# Patient Record
Sex: Male | Born: 1985 | Race: Black or African American | Hispanic: No | Marital: Single | Smoking: Current every day smoker
Health system: Southern US, Community
[De-identification: ages and names within clinical notes are randomized; demographics above are authoritative.]

## PROBLEM LIST (undated history)

## (undated) DIAGNOSIS — I209 Angina pectoris, unspecified: Secondary | ICD-10-CM

## (undated) DIAGNOSIS — R319 Hematuria, unspecified: Secondary | ICD-10-CM

## (undated) HISTORY — PX: HERNIA REPAIR: SHX51

## (undated) SURGERY — REPAIR, HERNIA, INGUINAL, INCARCERATED
Anesthesia: General | Laterality: Left

---

## 2005-08-08 ENCOUNTER — Emergency Department (HOSPITAL_COMMUNITY): Admission: EM | Admit: 2005-08-08 | Discharge: 2005-08-08 | Payer: Self-pay | Admitting: Emergency Medicine

## 2010-03-17 DIAGNOSIS — R319 Hematuria, unspecified: Secondary | ICD-10-CM

## 2010-03-17 HISTORY — DX: Hematuria, unspecified: R31.9

## 2011-09-04 ENCOUNTER — Emergency Department (HOSPITAL_COMMUNITY): Payer: Self-pay

## 2011-09-04 ENCOUNTER — Encounter (HOSPITAL_COMMUNITY): Payer: Self-pay | Admitting: *Deleted

## 2011-09-04 ENCOUNTER — Emergency Department (HOSPITAL_COMMUNITY)
Admission: EM | Admit: 2011-09-04 | Discharge: 2011-09-04 | Disposition: A | Discharge status: Home / Self Care | Payer: Self-pay | Attending: Emergency Medicine | Admitting: Emergency Medicine

## 2011-09-04 DIAGNOSIS — J020 Streptococcal pharyngitis: Secondary | ICD-10-CM | POA: Insufficient documentation

## 2011-09-04 LAB — DIFFERENTIAL
Basophils Absolute: 0 10*3/uL (ref 0.0–0.1)
Basophils Relative: 0 % (ref 0–1)
Neutro Abs: 9.4 10*3/uL — ABNORMAL HIGH (ref 1.7–7.7)

## 2011-09-04 LAB — CBC: RBC: 4.61 MIL/uL (ref 4.22–5.81)

## 2011-09-04 LAB — RAPID STREP SCREEN (MED CTR MEBANE ONLY): Streptococcus, Group A Screen (Direct): POSITIVE — AB

## 2011-09-04 LAB — BASIC METABOLIC PANEL
BUN: 13 mg/dL (ref 6–23)
CO2: 29 mEq/L (ref 19–32)
Chloride: 102 mEq/L (ref 96–112)
Creatinine, Ser: 1.08 mg/dL (ref 0.50–1.35)
Sodium: 137 mEq/L (ref 135–145)

## 2011-09-04 LAB — POCT I-STAT TROPONIN I

## 2011-09-04 MED ORDER — PENICILLIN G BENZATHINE 1200000 UNIT/2ML IM SUSP
1.2000 10*6.[IU] | INTRAMUSCULAR | Status: AC
  Administered 2011-09-04: 1.2 10*6.[IU] via INTRAMUSCULAR
  Filled 2011-09-04: qty 2

## 2011-09-04 MED ORDER — HYDROMORPHONE HCL PF 1 MG/ML IJ SOLN
1.0000 mg | Freq: Once | INTRAMUSCULAR | Status: AC
  Administered 2011-09-04: 1 mg via INTRAVENOUS
  Filled 2011-09-04: qty 1

## 2011-09-04 MED ORDER — KETOROLAC TROMETHAMINE 30 MG/ML IJ SOLN
30.0000 mg | Freq: Once | INTRAMUSCULAR | Status: AC
  Administered 2011-09-04: 30 mg via INTRAVENOUS
  Filled 2011-09-04: qty 1

## 2011-09-04 MED ORDER — SODIUM CHLORIDE 0.9 % IV BOLUS (SEPSIS)
1000.0000 mL | Freq: Once | INTRAVENOUS | Status: AC
  Administered 2011-09-04: 1000 mL via INTRAVENOUS

## 2011-09-04 NOTE — ED Provider Notes (Signed)
History     CSN: [id number]  Arrival date & time 09/04/11  1124   First MD Initiated Contact with Patient 09/04/11 1215      Chief Complaint  Patient presents with  . Sore Throat  . Chest Pain  . Back Pain    (Consider location/radiation/quality/duration/timing/severity/associated sxs/prior treatment) HPI Pt with multiple symptoms reports 2 weeks of moderate aching L chest pain, associated with muscle spasms and worse with movement. Denies any cough or SOB. Complaining of 2 weeks of sore throat, worse with swallowing and possible fever at home. He has not known medical problems. Took vicodin at home without relief.  History reviewed. No pertinent past medical history.  History reviewed. No pertinent past surgical history.  No family history on file.  History  Substance Use Topics  . Smoking status: Current Everyday Smoker  . Smokeless tobacco: Not on file  . Alcohol Use: Not on file      Review of Systems Unable to assess due to mental status.   Allergies  Review of patient's allergies indicates no known allergies.  Home Medications   Current Outpatient Rx  Name Route Sig Dispense Refill  . HYDROCODONE-ACETAMINOPHEN 5-500 MG PO TABS Oral Take 2 tablets by mouth once.      BP 110/61  Pulse 50  Temp 98.2 F (36.8 C) (Oral)  Resp 16  Ht 5\' 11"  (1.803 m)  Wt 170 lb (77.111 kg)  BMI 23.71 kg/m2  SpO2 100%  Physical Exam  Nursing note and vitals reviewed. Constitutional: He is oriented to person, place, and time. He appears well-developed and well-nourished.  HENT:  Head: Normocephalic and atraumatic.  Mouth/Throat: Oropharyngeal exudate present.       Tonsils erythematous  Eyes: EOM are normal. Pupils are equal, round, and reactive to light.  Neck: Normal range of motion. Neck supple.  Cardiovascular: Normal rate, normal heart sounds and intact distal pulses.   Pulmonary/Chest: Effort normal and breath sounds normal. He exhibits no tenderness.    Abdominal: Bowel sounds are normal. He exhibits no distension. There is no tenderness.  Musculoskeletal: Normal range of motion. He exhibits no edema and no tenderness.  Neurological: He is alert and oriented to person, place, and time. He has normal strength. No cranial nerve deficit or sensory deficit.  Skin: Skin is warm and dry. No rash noted.  Psychiatric: He has a normal mood and affect.    ED Course  Procedures (including critical care time)  Labs Reviewed  CBC - Abnormal; Notable for the following:    WBC 12.3 (*)     All other components within normal limits  DIFFERENTIAL - Abnormal; Notable for the following:    Neutro Abs 9.4 (*)     Monocytes Absolute 1.4 (*)     All other components within normal limits  RAPID STREP SCREEN - Abnormal; Notable for the following:    Streptococcus, Group A Screen (Direct) POSITIVE (*)     All other components within normal limits  BASIC METABOLIC PANEL  POCT I-STAT TROPONIN I   Dg Chest 2 View  09/04/2011  *RADIOLOGY REPORT*  Clinical Data: Chest pain.  CHEST - 2 VIEW  Comparison: None.  Findings: Heart and mediastinal contours are within normal limits. No focal opacities or effusions.  No acute bony abnormality.  IMPRESSION: No active cardiopulmonary disease.  Original Report Authenticated By: Cyndie Chime, M.D.     No diagnosis found.    MDM  Strep positive. Labs normal otherwise. Pt given PCN.  Advised to continue oral fluids and PCP followup.   Date: 09/04/2011  Rate: 57  Rhythm: normal sinus rhythm  QRS Axis: normal  Intervals: normal  ST/T Wave abnormalities: normal  Conduction Disutrbances: none  Narrative Interpretation: unremarkable           Mica Releford B. Bernette Mayers, MD 09/04/11 1456

## 2011-09-04 NOTE — ED Notes (Signed)
Patient reports he has had chest pain, sore throat, and back pain for a day or two. Patient unsure if he has had a fever. Patient denies cough

## 2011-09-04 NOTE — Discharge Instructions (Signed)

## 2011-09-08 ENCOUNTER — Encounter (HOSPITAL_COMMUNITY): Payer: Self-pay | Admitting: *Deleted

## 2011-09-08 DIAGNOSIS — F1210 Cannabis abuse, uncomplicated: Secondary | ICD-10-CM | POA: Insufficient documentation

## 2011-09-08 DIAGNOSIS — R109 Unspecified abdominal pain: Secondary | ICD-10-CM | POA: Insufficient documentation

## 2011-09-08 DIAGNOSIS — K4030 Unilateral inguinal hernia, with obstruction, without gangrene, not specified as recurrent: Principal | ICD-10-CM | POA: Insufficient documentation

## 2011-09-08 DIAGNOSIS — F17200 Nicotine dependence, unspecified, uncomplicated: Secondary | ICD-10-CM | POA: Insufficient documentation

## 2011-09-08 HISTORY — PX: INGUINAL HERNIA REPAIR: SUR1180

## 2011-09-08 LAB — URINE MICROSCOPIC-ADD ON

## 2011-09-08 LAB — URINALYSIS, ROUTINE W REFLEX MICROSCOPIC
Bilirubin Urine: NEGATIVE
Ketones, ur: NEGATIVE mg/dL
Leukocytes, UA: NEGATIVE
Nitrite: NEGATIVE
Protein, ur: NEGATIVE mg/dL
Specific Gravity, Urine: 1.034 — ABNORMAL HIGH (ref 1.005–1.030)

## 2011-09-08 NOTE — ED Notes (Signed)
The pt is c/o rt groin pain for 2-3 days.  Worse tonight while at work.  The swelling in his rt groin is increasing in size

## 2011-09-09 ENCOUNTER — Encounter (HOSPITAL_COMMUNITY)
Admission: EM | Disposition: A | Discharge status: Home / Self Care | Payer: Self-pay | Source: Ambulatory Visit | Attending: Emergency Medicine

## 2011-09-09 ENCOUNTER — Encounter (HOSPITAL_COMMUNITY): Payer: Self-pay | Admitting: Emergency Medicine

## 2011-09-09 ENCOUNTER — Observation Stay (HOSPITAL_COMMUNITY)
Admission: EM | Admit: 2011-09-09 | Discharge: 2011-09-09 | Disposition: A | Discharge status: Home / Self Care | Payer: Self-pay | Source: Ambulatory Visit | Attending: Surgery | Admitting: Surgery

## 2011-09-09 ENCOUNTER — Encounter (HOSPITAL_COMMUNITY): Payer: Self-pay | Admitting: Anesthesiology

## 2011-09-09 ENCOUNTER — Emergency Department (HOSPITAL_COMMUNITY): Payer: Self-pay | Admitting: Anesthesiology

## 2011-09-09 ENCOUNTER — Encounter (HOSPITAL_COMMUNITY): Payer: Self-pay | Admitting: *Deleted

## 2011-09-09 DIAGNOSIS — G8918 Other acute postprocedural pain: Secondary | ICD-10-CM | POA: Insufficient documentation

## 2011-09-09 DIAGNOSIS — K4030 Unilateral inguinal hernia, with obstruction, without gangrene, not specified as recurrent: Secondary | ICD-10-CM

## 2011-09-09 DIAGNOSIS — N5089 Other specified disorders of the male genital organs: Secondary | ICD-10-CM | POA: Insufficient documentation

## 2011-09-09 DIAGNOSIS — Z9889 Other specified postprocedural states: Secondary | ICD-10-CM | POA: Insufficient documentation

## 2011-09-09 HISTORY — PX: INGUINAL HERNIA REPAIR: SHX194

## 2011-09-09 HISTORY — DX: Angina pectoris, unspecified: I20.9

## 2011-09-09 HISTORY — DX: Hematuria, unspecified: R31.9

## 2011-09-09 LAB — BASIC METABOLIC PANEL
Calcium: 9.6 mg/dL (ref 8.4–10.5)
Creatinine, Ser: 1.07 mg/dL (ref 0.50–1.35)
GFR calc non Af Amer: >90 mL/min (ref >90)
Sodium: 139 mEq/L (ref 135–145)

## 2011-09-09 LAB — CBC
HCT: 43.2 % (ref 39.0–52.0)
MCH: 30.1 pg (ref 26.0–34.0)
MCHC: 35 g/dL (ref 30.0–36.0)
MCV: 86.1 fL (ref 78.0–100.0)
RBC: 5.02 MIL/uL (ref 4.22–5.81)

## 2011-09-09 SURGERY — REPAIR, HERNIA, INGUINAL, ADULT
Anesthesia: General | Site: Groin | Laterality: Left | Wound class: Clean

## 2011-09-09 MED ORDER — OXYCODONE-ACETAMINOPHEN 5-325 MG PO TABS
1.0000 | ORAL_TABLET | ORAL | Status: DC | PRN
  Administered 2011-09-09: 1 via ORAL
  Administered 2011-09-09: 2 via ORAL
  Filled 2011-09-09: qty 2

## 2011-09-09 MED ORDER — ENOXAPARIN SODIUM 40 MG/0.4ML ~~LOC~~ SOLN
40.0000 mg | SUBCUTANEOUS | Status: DC
  Filled 2011-09-09: qty 0.4

## 2011-09-09 MED ORDER — SODIUM CHLORIDE 0.9 % IR SOLN
Status: DC | PRN
  Administered 2011-09-09: 06:00:00

## 2011-09-09 MED ORDER — CEFAZOLIN SODIUM-DEXTROSE 2-3 GM-% IV SOLR
INTRAVENOUS | Status: AC
  Filled 2011-09-09: qty 50

## 2011-09-09 MED ORDER — NEOSTIGMINE METHYLSULFATE 1 MG/ML IJ SOLN
INTRAMUSCULAR | Status: DC | PRN
  Administered 2011-09-09: 2.5 mg via INTRAVENOUS

## 2011-09-09 MED ORDER — LIDOCAINE HCL (CARDIAC) 20 MG/ML IV SOLN
INTRAVENOUS | Status: DC | PRN
  Administered 2011-09-09: 100 mg via INTRAVENOUS

## 2011-09-09 MED ORDER — CEFAZOLIN SODIUM-DEXTROSE 2-3 GM-% IV SOLR
2.0000 g | INTRAVENOUS | Status: AC
  Administered 2011-09-09: 2 g via INTRAVENOUS

## 2011-09-09 MED ORDER — HYDROMORPHONE HCL PF 1 MG/ML IJ SOLN
INTRAMUSCULAR | Status: AC
  Filled 2011-09-09: qty 1

## 2011-09-09 MED ORDER — ROCURONIUM BROMIDE 100 MG/10ML IV SOLN
INTRAVENOUS | Status: DC | PRN
  Administered 2011-09-09: 30 mg via INTRAVENOUS

## 2011-09-09 MED ORDER — SODIUM CHLORIDE 0.9 % IV SOLN
INTRAVENOUS | Status: DC
  Administered 2011-09-09: 08:00:00 via INTRAVENOUS
  Administered 2011-09-09: 1000 mL via INTRAVENOUS

## 2011-09-09 MED ORDER — GLYCOPYRROLATE 0.2 MG/ML IJ SOLN
INTRAMUSCULAR | Status: DC | PRN
  Administered 2011-09-09: .4 mg via INTRAVENOUS
  Administered 2011-09-09: 0.2 mg via INTRAVENOUS

## 2011-09-09 MED ORDER — 0.9 % SODIUM CHLORIDE (POUR BTL) OPTIME
TOPICAL | Status: DC | PRN
  Administered 2011-09-09: 1000 mL

## 2011-09-09 MED ORDER — FENTANYL CITRATE 0.05 MG/ML IJ SOLN
INTRAMUSCULAR | Status: DC | PRN
  Administered 2011-09-09: 100 ug via INTRAVENOUS

## 2011-09-09 MED ORDER — LORAZEPAM 2 MG/ML IJ SOLN: 1.0000 mg | Freq: Once | INTRAMUSCULAR | Status: DC | PRN

## 2011-09-09 MED ORDER — SUCCINYLCHOLINE CHLORIDE 20 MG/ML IJ SOLN
INTRAMUSCULAR | Status: DC | PRN
  Administered 2011-09-09: 120 mg via INTRAVENOUS

## 2011-09-09 MED ORDER — SODIUM CHLORIDE 0.9 % IV SOLN
INTRAVENOUS | Status: DC
  Administered 2011-09-09: 09:00:00 via INTRAVENOUS

## 2011-09-09 MED ORDER — MIDAZOLAM HCL 5 MG/5ML IJ SOLN
INTRAMUSCULAR | Status: DC | PRN
  Administered 2011-09-09: 2 mg via INTRAVENOUS

## 2011-09-09 MED ORDER — PNEUMOCOCCAL VAC POLYVALENT 25 MCG/0.5ML IJ INJ: 0.5000 mL | INJECTION | INTRAMUSCULAR | Status: DC

## 2011-09-09 MED ORDER — HYDROMORPHONE HCL PF 1 MG/ML IJ SOLN
1.0000 mg | Freq: Once | INTRAMUSCULAR | Status: AC
  Administered 2011-09-09: 1 mg via INTRAVENOUS
  Filled 2011-09-09: qty 1

## 2011-09-09 MED ORDER — ONDANSETRON HCL 4 MG/2ML IJ SOLN: 4.0000 mg | Freq: Four times a day (QID) | INTRAMUSCULAR | Status: DC | PRN

## 2011-09-09 MED ORDER — PROPOFOL 10 MG/ML IV BOLUS
INTRAVENOUS | Status: DC | PRN
  Administered 2011-09-09: 200 mg via INTRAVENOUS

## 2011-09-09 MED ORDER — HYDROMORPHONE HCL PF 1 MG/ML IJ SOLN
0.2500 mg | INTRAMUSCULAR | Status: DC | PRN
  Administered 2011-09-09 (×2): 0.5 mg via INTRAVENOUS

## 2011-09-09 MED ORDER — ONDANSETRON HCL 4 MG/2ML IJ SOLN
INTRAMUSCULAR | Status: DC | PRN
  Administered 2011-09-09: 4 mg via INTRAVENOUS

## 2011-09-09 MED ORDER — SODIUM CHLORIDE 0.9 % IV SOLN
INTRAVENOUS | Status: DC | PRN
  Administered 2011-09-09: 05:00:00 via INTRAVENOUS

## 2011-09-09 MED ORDER — OXYCODONE-ACETAMINOPHEN 5-325 MG PO TABS
ORAL_TABLET | ORAL | Status: AC
  Filled 2011-09-09: qty 2

## 2011-09-09 MED ORDER — FENTANYL CITRATE 0.05 MG/ML IJ SOLN: 50.0000 ug | INTRAMUSCULAR | Status: DC | PRN

## 2011-09-09 MED ORDER — BUPIVACAINE HCL (PF) 0.25 % IJ SOLN
INTRAMUSCULAR | Status: DC | PRN
  Administered 2011-09-09: 19 mL

## 2011-09-09 MED ORDER — OXYCODONE-ACETAMINOPHEN 5-325 MG PO TABS: 1.0000 | ORAL_TABLET | ORAL | Status: AC | PRN

## 2011-09-09 MED ORDER — ONDANSETRON HCL 4 MG PO TABS: 4.0000 mg | ORAL_TABLET | Freq: Four times a day (QID) | ORAL | Status: DC | PRN

## 2011-09-09 MED ORDER — CEFAZOLIN SODIUM-DEXTROSE 2-3 GM-% IV SOLR: 2.0000 g | Freq: Once | INTRAVENOUS | Status: DC

## 2011-09-09 MED ORDER — ONDANSETRON HCL 4 MG/2ML IJ SOLN
4.0000 mg | Freq: Once | INTRAMUSCULAR | Status: AC
  Administered 2011-09-09: 4 mg via INTRAVENOUS
  Filled 2011-09-09: qty 2

## 2011-09-09 MED ORDER — HYDROMORPHONE HCL PF 1 MG/ML IJ SOLN: 1.0000 mg | INTRAMUSCULAR | Status: DC | PRN

## 2011-09-09 MED ORDER — MIDAZOLAM HCL 2 MG/2ML IJ SOLN: 1.0000 mg | INTRAMUSCULAR | Status: DC | PRN

## 2011-09-09 MED ORDER — BUPIVACAINE HCL (PF) 0.25 % IJ SOLN
INTRAMUSCULAR | Status: AC
  Filled 2011-09-09: qty 30

## 2011-09-09 SURGICAL SUPPLY — 53 items
ADH SKN CLS APL DERMABOND .7 (GAUZE/BANDAGES/DRESSINGS) ×1
BAG DECANTER FOR FLEXI CONT (MISCELLANEOUS) ×2 IMPLANT
BLADE SURG 10 STRL SS (BLADE) ×2 IMPLANT
BLADE SURG 15 STRL LF DISP TIS (BLADE) ×1 IMPLANT
BLADE SURG 15 STRL SS (BLADE) ×1
BLADE SURG ROTATE 9660 (MISCELLANEOUS) IMPLANT
CANISTER SUCTION 2500CC (MISCELLANEOUS) IMPLANT
CHLORAPREP W/TINT 26ML (MISCELLANEOUS) ×2 IMPLANT
CLEANER TIP ELECTROSURG 2X2 (MISCELLANEOUS) ×2 IMPLANT
CLOTH BEACON ORANGE TIMEOUT ST (SAFETY) ×2 IMPLANT
COVER SURGICAL LIGHT HANDLE (MISCELLANEOUS) ×2 IMPLANT
DECANTER SPIKE VIAL GLASS SM (MISCELLANEOUS) IMPLANT
DERMABOND ADVANCED (GAUZE/BANDAGES/DRESSINGS) ×1
DERMABOND ADVANCED .7 DNX12 (GAUZE/BANDAGES/DRESSINGS) ×1 IMPLANT
DRAIN PENROSE 1/2X12 LTX STRL (WOUND CARE) ×2 IMPLANT
DRAPE LAPAROTOMY TRNSV 102X78 (DRAPE) ×2 IMPLANT
DRAPE UTILITY 15X26 W/TAPE STR (DRAPE) ×4 IMPLANT
DRSG TEGADERM 4X4.75 (GAUZE/BANDAGES/DRESSINGS) ×2 IMPLANT
ELECT REM PT RETURN 9FT ADLT (ELECTROSURGICAL) ×2
ELECTRODE REM PT RTRN 9FT ADLT (ELECTROSURGICAL) ×1 IMPLANT
GAUZE SPONGE 4X4 16PLY XRAY LF (GAUZE/BANDAGES/DRESSINGS) ×2 IMPLANT
GLOVE BIO SURGEON STRL SZ7.5 (GLOVE) ×2 IMPLANT
GLOVE BIOGEL PI IND STRL 7.0 (GLOVE) ×2 IMPLANT
GLOVE BIOGEL PI IND STRL 8 (GLOVE) ×1 IMPLANT
GLOVE BIOGEL PI INDICATOR 7.0 (GLOVE) ×2
GLOVE BIOGEL PI INDICATOR 8 (GLOVE) ×1
GLOVE ECLIPSE 7.5 STRL STRAW (GLOVE) ×2 IMPLANT
GLOVE SURG SS PI 7.0 STRL IVOR (GLOVE) ×4 IMPLANT
GOWN STRL NON-REIN LRG LVL3 (GOWN DISPOSABLE) ×6 IMPLANT
KIT BASIN OR (CUSTOM PROCEDURE TRAY) ×2 IMPLANT
KIT ROOM TURNOVER OR (KITS) ×2 IMPLANT
NEEDLE HYPO 25GX1X1/2 BEV (NEEDLE) ×2 IMPLANT
NS IRRIG 1000ML POUR BTL (IV SOLUTION) ×2 IMPLANT
PACK SURGICAL SETUP 50X90 (CUSTOM PROCEDURE TRAY) ×2 IMPLANT
PAD ARMBOARD 7.5X6 YLW CONV (MISCELLANEOUS) ×4 IMPLANT
PENCIL BUTTON HOLSTER BLD 10FT (ELECTRODE) ×2 IMPLANT
SPECIMEN JAR SMALL (MISCELLANEOUS) IMPLANT
SPONGE INTESTINAL PEANUT (DISPOSABLE) ×2 IMPLANT
SPONGE LAP 18X18 X RAY DECT (DISPOSABLE) ×2 IMPLANT
STRIP CLOSURE SKIN 1/2X4 (GAUZE/BANDAGES/DRESSINGS) ×2 IMPLANT
SUT ETHIBOND 0 MO6 C/R (SUTURE) ×2 IMPLANT
SUT MON AB 4-0 PC3 18 (SUTURE) ×2 IMPLANT
SUT PROLENE 0 CT 2 (SUTURE) ×4 IMPLANT
SUT VIC AB 3-0 SH 27 (SUTURE) ×4
SUT VIC AB 3-0 SH 27X BRD (SUTURE) ×2 IMPLANT
SUT VICRYL AB 3 0 TIES (SUTURE) ×2 IMPLANT
SYR BULB 3OZ (MISCELLANEOUS) ×2 IMPLANT
SYR CONTROL 10ML LL (SYRINGE) ×2 IMPLANT
TOWEL OR 17X24 6PK STRL BLUE (TOWEL DISPOSABLE) ×2 IMPLANT
TOWEL OR 17X26 10 PK STRL BLUE (TOWEL DISPOSABLE) ×2 IMPLANT
TUBE CONNECTING 12X1/4 (SUCTIONS) IMPLANT
WATER STERILE IRR 1000ML POUR (IV SOLUTION) IMPLANT
YANKAUER SUCT BULB TIP NO VENT (SUCTIONS) ×2 IMPLANT

## 2011-09-09 NOTE — Anesthesia Postprocedure Evaluation (Signed)
  Anesthesia Post-op Note  Patient: Trevor Clark  Procedure(s) Performed: Procedure(s) (LRB): HERNIA REPAIR INGUINAL ADULT (Left)  Patient Location: PACU  Anesthesia Type: General  Level of Consciousness: awake and alert   Airway and Oxygen Therapy: Patient Spontanous Breathing  Post-op Pain: mild  Post-op Assessment: Post-op Vital signs reviewed, Patient's Cardiovascular Status Stable, Respiratory Function Stable, Patent Airway, No signs of Nausea or vomiting and Pain level controlled  Post-op Vital Signs: stable  Complications: No apparent anesthesia complications

## 2011-09-09 NOTE — Anesthesia Preprocedure Evaluation (Addendum)
Anesthesia Evaluation  Patient identified by MRN, date of birth, ID band Patient awake    Reviewed: Allergy & Precautions, H&P , NPO status , Patient's Chart, lab work & pertinent test results  Airway Mallampati: I TM Distance: >3 FB Neck ROM: Full    Dental  (+) Teeth Intact and Dental Advisory Given   Pulmonary Current Smoker,  breath sounds clear to auscultation        Cardiovascular negative cardio ROS  Rhythm:Regular Rate:Normal     Neuro/Psych negative neurological ROS  negative psych ROS   GI/Hepatic Neg liver ROS, (+)     substance abuse  marijuana use, Incarcerated L inguinal hernia   Endo/Other  negative endocrine ROS  Renal/GU negative Renal ROS  negative genitourinary   Musculoskeletal negative musculoskeletal ROS (+)   Abdominal (+)  Abdomen: tender.    Peds  Hematology negative hematology ROS (+)   Anesthesia Other Findings   Reproductive/Obstetrics                          Anesthesia Physical Anesthesia Plan  ASA: II and Emergent  Anesthesia Plan: General   Post-op Pain Management:    Induction: Intravenous, Cricoid pressure planned and Rapid sequence  Airway Management Planned: Oral ETT  Additional Equipment:   Intra-op Plan:   Post-operative Plan: Extubation in OR  Informed Consent: I have reviewed the patients History and Physical, chart, labs and discussed the procedure including the risks, benefits and alternatives for the proposed anesthesia with the patient or authorized representative who has indicated his/her understanding and acceptance.   Dental advisory given  Plan Discussed with: Surgeon and CRNA  Anesthesia Plan Comments:        Anesthesia Quick Evaluation

## 2011-09-09 NOTE — ED Notes (Signed)
Patient with hernia surgery yesterday and released from hospital at approx 1400 today, patient now with testicular swelling and was told if any swelling to return to hospital.  Patient states he is able to void but with pain.

## 2011-09-09 NOTE — Transfer of Care (Signed)
Immediate Anesthesia Transfer of Care Note  Patient: Trevor Clark  Procedure(s) Performed: Procedure(s) (LRB): HERNIA REPAIR INGUINAL ADULT (Left)  Patient Location: PACU  Anesthesia Type: General  Level of Consciousness: sedated and patient cooperative  Airway & Oxygen Therapy: Patient connected to nasal cannula oxygen  Post-op Assessment: Report given to PACU RN, Post -op Vital signs reviewed and stable and Patient moving all extremities X 4  Post vital signs: Reviewed and stable  Complications: No apparent anesthesia complications

## 2011-09-09 NOTE — Discharge Summary (Signed)
Physician Discharge Summary  Patient ID: Trevor Clark MRN: [id number] DOB/AGE: 1985-09-01 26 y.o.  Admit date: 09/09/2011 Discharge date: 09/09/2011  Admission Diagnoses: Incarcerated inguinal hernia for 2 days   Discharge Diagnoses: Inguinal hernia repair  Discharged Condition: stable  Hospital Course: Patient presented to [facility] with c/o left groin pain. He has a known hernia for over 2 years, incarcerated last 2 days, not reducible. 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 Symptoms constant, progressively worsening. For this reason he was taken to the operating room for reduction of his hernia. He tolerated this procedure well and has since remained afebrile and hemodynamically stable. He is now stable for discharge home, with follow-up with our office in 2-3 weeks.     Consults: None  Significant Diagnostic Studies: labs  Treatments: IV hydration, antibiotics,analgesia,and surgery.  Discharge Exam: Blood pressure 128/77, pulse 51, temperature 98.7 F (37.1 C), temperature source Oral, resp. rate 18, SpO2 96.00%. General appearance: alert, cooperative and no distress Abdomen is soft, +BS,+flatus, no bloating, tender to palpation around surgical incisions with palpation.  Disposition: home, self care with f/u our office in 2-3 weeks.   Medication List  As of 09/09/2011  3:38 PM   ASK your doctor about these medications         cyclobenzaprine 5 MG tablet   Commonly known as: FLEXERIL   Take 5 mg by mouth once.      HYDROcodone-acetaminophen 5-500 MG per tablet   Commonly known as: VICODIN   Take 2 tablets by mouth once.             SignedBlenda Mounts 09/09/2011, 3:38 PM

## 2011-09-09 NOTE — Discharge Instructions (Signed)
Inguinal Hernia, Adult  Muscles help keep everything in the body in its proper place. But if a weak spot in the muscles develops, something can poke through. That is called a hernia. When this happens in the lower part of the belly (abdomen), it is called an inguinal hernia. (It takes its name from a part of the body in this region called the inguinal canal.) A weak spot in the wall of muscles lets some fat or part of the small intestine bulge through. An inguinal hernia can develop at any age. Men get them more often than women.  CAUSES   In adults, an inguinal hernia develops over time.  · It can be triggered by:  · Suddenly straining the muscles of the lower abdomen.  · Lifting heavy objects.  · Straining to have a bowel movement. Difficult bowel movements (constipation) can lead to this.  · Constant coughing. This may be caused by smoking or lung disease.  · Being overweight.  · Being pregnant.  · Working at a job that requires long periods of standing or heavy lifting.  · Having had an inguinal hernia before.  One type can be an emergency situation. It is called a strangulated inguinal hernia. It develops if part of the small intestine slips through the weak spot and cannot get back into the abdomen. The blood supply can be cut off. If that happens, part of the intestine may die. This situation requires emergency surgery.  SYMPTOMS   Often, a small inguinal hernia has no symptoms. It is found when a healthcare provider does a physical exam. Larger hernias usually have symptoms.   · In adults, symptoms may include:  · A lump in the groin. This is easier to see when the person is standing. It might disappear when lying down.  · In men, a lump in the scrotum.  · Pain or burning in the groin. This occurs especially when lifting, straining or coughing.  · A dull ache or feeling of pressure in the groin.  · Signs of a strangulated hernia can include:  · A bulge in the groin that becomes very painful and tender to the  touch.  · A bulge that turns red or purple.  · Fever, nausea and vomiting.  · Inability to have a bowel movement or to pass gas.  DIAGNOSIS   To decide if you have an inguinal hernia, a healthcare provider will probably do a physical examination.  · This will include asking questions about any symptoms you have noticed.  · The healthcare provider might feel the groin area and ask you to cough. If an inguinal hernia is felt, the healthcare provider may try to slide it back into the abdomen.  · Usually no other tests are needed.  TREATMENT   Treatments can vary. The size of the hernia makes a difference. Options include:  · Watchful waiting. This is often suggested if the hernia is small and you have had no symptoms.  · No medical procedure will be done unless symptoms develop.  · You will need to watch closely for symptoms. If any occur, contact your healthcare provider right away.  · Surgery. This is used if the hernia is larger or you have symptoms.  · Open surgery. This is usually an outpatient procedure (you will not stay overnight in a hospital). An cut (incision) is made through the skin in the groin. The hernia is put back inside the abdomen. The weak area in the muscles is   then repaired by herniorrhaphy or hernioplasty. Herniorrhaphy: in this type of surgery, the weak muscles are sewn back together. Hernioplasty: a patch or mesh is used to close the weak area in the abdominal wall.  · Laparoscopy. In this procedure, a surgeon makes small incisions. A thin tube with a tiny video camera (called a laparoscope) is put into the abdomen. The surgeon repairs the hernia with mesh by looking with the video camera and using two long instruments.  HOME CARE INSTRUCTIONS   · After surgery to repair an inguinal hernia:  · You will need to take pain medicine prescribed by your healthcare provider. Follow all directions carefully.  · You will need to take care of the wound from the incision.  · Your activity will be  restricted for awhile. This will probably include no heavy lifting for several weeks. You also should not do anything too active for a few weeks. When you can return to work will depend on the type of job that you have.  · During "watchful waiting" periods, you should:  · Maintain a healthy weight.  · Eat a diet high in fiber (fruits, vegetables and whole grains).  · Drink plenty of fluids to avoid constipation. This means drinking enough water and other liquids to keep your urine clear or pale yellow.  · Do not lift heavy objects.  · Do not stand for long periods of time.  · Quit smoking. This should keep you from developing a frequent cough.  SEEK MEDICAL CARE IF:   · A bulge develops in your groin area.  · You feel pain, a burning sensation or pressure in the groin. This might be worse if you are lifting or straining.  · You develop a fever of more than 100.5° F (38.1° C).  SEEK IMMEDIATE MEDICAL CARE IF:   · Pain in the groin increases suddenly.  · A bulge in the groin gets bigger suddenly and does not go down.  · For men, there is sudden pain in the scrotum. Or, the size of the scrotum increases.  · A bulge in the groin area becomes red or purple and is painful to touch.  · You have nausea or vomiting that does not go away.  · You feel your heart beating much faster than normal.  · You cannot have a bowel movement or pass gas.  · You develop a fever of more than 102.0° F (38.9° C).  Document Released: 07/20/2008 Document Revised: 02/20/2011 Document Reviewed: 07/20/2008  [employer]® Patient Information ©2012 [employer].

## 2011-09-09 NOTE — Preoperative (Signed)
Beta Blockers   Reason not to administer Beta Blockers:Not Applicable 

## 2011-09-09 NOTE — Anesthesia Procedure Notes (Signed)
Procedure Name: Intubation Date/Time: 09/09/2011 5:24 AM Performed by: Molli Hazard Pre-anesthesia Checklist: Patient identified, Emergency Drugs available, Suction available and Patient being monitored Patient Re-evaluated:Patient Re-evaluated prior to inductionOxygen Delivery Method: Circle system utilized Preoxygenation: Pre-oxygenation with 100% oxygen Intubation Type: IV induction, Rapid sequence and Cricoid Pressure applied Laryngoscope Size: Miller and 2 Grade View: Grade I Tube type: Oral Tube size: 8.0 mm Number of attempts: 1 Airway Equipment and Method: Stylet Placement Confirmation: ETT inserted through vocal cords under direct vision,  positive ETCO2 and breath sounds checked- equal and bilateral Secured at: 24 cm Tube secured with: Tape Dental Injury: Teeth and Oropharynx as per pre-operative assessment

## 2011-09-09 NOTE — ED Notes (Signed)
Patient with right side groin pain.  Patient states his pain has been getting worse today.  No nausea or vomiting, large bulge, size of golf ball at right groin.  Tender to touch.

## 2011-09-09 NOTE — ED Notes (Signed)
Dr Wyatt at bedside.  

## 2011-09-09 NOTE — ED Provider Notes (Signed)
History     CSN: [id number]  Arrival date & time 09/08/11  2139   First MD Initiated Contact with Patient 09/09/11 0030      Chief Complaint  Patient presents with  . Groin Pain    (Consider location/radiation/quality/duration/timing/severity/associated sxs/prior treatment) HPI History provided by patient. Left groin pain. Onset 2 days ago while lifting furniture at work. Has had a swelling and pain in the area ever since. Worse with exertion. Not relieved by rest. Sharp in quality and not radiating. No trauma otherwise. No difficulty urinating. No nausea vomiting. No recent constipation. The history the same. Symptoms constant, progressively worsening. History reviewed. No pertinent past medical history.  History reviewed. No pertinent past surgical history.  No family history on file.  History  Substance Use Topics  . Smoking status: Current Everyday Smoker  . Smokeless tobacco: Not on file  . Alcohol Use: Not on file      Review of Systems  Constitutional: Negative for fever and chills.  HENT: Negative for neck pain and neck stiffness.   Eyes: Negative for pain.  Respiratory: Negative for shortness of breath.   Cardiovascular: Negative for chest pain.  Gastrointestinal: Positive for abdominal pain.  Genitourinary: Negative for dysuria, hematuria, discharge and testicular pain.  Musculoskeletal: Negative for back pain.  Skin: Negative for rash.  Neurological: Negative for headaches.  All other systems reviewed and are negative.    Allergies  Review of patient's allergies indicates no known allergies.  Home Medications   Current Outpatient Rx  Name Route Sig Dispense Refill  . CYCLOBENZAPRINE HCL 5 MG PO TABS Oral Take 5 mg by mouth once.    Marland Kitchen HYDROCODONE-ACETAMINOPHEN 5-500 MG PO TABS Oral Take 2 tablets by mouth once.      BP 114/70  Pulse 60  Temp 97.3 F (36.3 C) (Oral)  Resp 18  SpO2 100%  Physical Exam  Constitutional: He is oriented to person,  place, and time. He appears well-developed and well-nourished.  HENT:  Head: Normocephalic and atraumatic.  Eyes: Conjunctivae and EOM are normal. Pupils are equal, round, and reactive to light.  Neck: Trachea normal. Neck supple. No thyromegaly present.  Cardiovascular: Normal rate, regular rhythm, S1 normal, S2 normal and normal pulses.     No systolic murmur is present   No diastolic murmur is present  Pulses:      Radial pulses are 2+ on the right side, and 2+ on the left side.  Pulmonary/Chest: Effort normal and breath sounds normal. He has no wheezes. He has no rhonchi. He has no rales. He exhibits no tenderness.  Abdominal: Soft. Normal appearance and bowel sounds are normal. There is no CVA tenderness and negative Murphy's sign.       Left inguinal mass and tenderness, not reducible.   Genitourinary: Penis normal.       No testicle fullness or Tenderness  Neurological: He is alert and oriented to person, place, and time. He has normal strength. No cranial nerve deficit or sensory deficit. GCS eye subscore is 4. GCS verbal subscore is 5. GCS motor subscore is 6.  Skin: Skin is warm and dry. No rash noted. He is not diaphoretic.  Psychiatric: His speech is normal.       Cooperative and appropriate    ED Course  Procedures (including critical care time)  Labs Reviewed  URINALYSIS, ROUTINE W REFLEX MICROSCOPIC - Abnormal; Notable for the following:    Specific Gravity, Urine 1.034 (*)     Hgb urine  dipstick MODERATE (*)     All other components within normal limits  URINE MICROSCOPIC-ADD ON - Abnormal; Notable for the following:    Bacteria, UA MANY (*)     All other components within normal limits  CBC  BASIC METABOLIC PANEL   IV Dilaudid x 2, placed in Trendelenburg, multiple attempts to reduce hernia without success.  3:34 AM d/w DR Lindie Spruce on call for [facility], will eval in ED. recommends hold imaging.  Dr. Lindie Spruce evaluated and took patient to the OR. MDM   Left inguinal  hernia not reducible. Plan OR.        Sunnie Nielsen, MD 09/09/11 [phone]

## 2011-09-09 NOTE — H&P (Signed)
Trevor Clark is an 26 y.o. male.   Chief Complaint: Incarcerated inguinal hernia for 2 days HPI: Hernia known for over 2 years, incarcerated last 2 days, not reducible.  Does heavy lifting for a living.  History reviewed. No pertinent past medical history.  History reviewed. No pertinent past surgical history.  No family history on file. Social History:  reports that he has been smoking.  He does not have any smokeless tobacco history on file. He reports that he uses illicit drugs (Marijuana). His alcohol history not on file.  Allergies: No Known Allergies   (Not in a hospital admission)  Results for orders placed during the hospital encounter of 09/09/11 (from the past 48 hour(s))  URINALYSIS, ROUTINE W REFLEX MICROSCOPIC     Status: Abnormal   Collection Time   09/08/11  9:55 PM      Component Value Range Comment   Color, Urine YELLOW  YELLOW    APPearance CLEAR  CLEAR    Specific Gravity, Urine 1.034 (*) 1.005 - 1.030    pH 6.0  5.0 - 8.0    Glucose, UA NEGATIVE  NEGATIVE mg/dL    Hgb urine dipstick MODERATE (*) NEGATIVE    Bilirubin Urine NEGATIVE  NEGATIVE    Ketones, ur NEGATIVE  NEGATIVE mg/dL    Protein, ur NEGATIVE  NEGATIVE mg/dL    Urobilinogen, UA 1.0  0.0 - 1.0 mg/dL    Nitrite NEGATIVE  NEGATIVE    Leukocytes, UA NEGATIVE  NEGATIVE   URINE MICROSCOPIC-ADD ON     Status: Abnormal   Collection Time   09/08/11  9:55 PM      Component Value Range Comment   Squamous Epithelial / LPF RARE  RARE    WBC, UA 0-2  <3 WBC/hpf    RBC / HPF 3-6  <3 RBC/hpf    Bacteria, UA MANY (*) RARE   CBC     Status: Normal   Collection Time   09/09/11 12:29 AM      Component Value Range Comment   WBC 8.9  4.0 - 10.5 K/uL    RBC 5.02  4.22 - 5.81 MIL/uL    Hemoglobin 15.1  13.0 - 17.0 g/dL    HCT 16.1  09.6 - 04.5 %    MCV 86.1  78.0 - 100.0 fL    MCH 30.1  26.0 - 34.0 pg    MCHC 35.0  30.0 - 36.0 g/dL    RDW 40.9  81.1 - 91.4 %    Platelets 264  150 - 400 K/uL   BASIC METABOLIC  PANEL     Status: Normal   Collection Time   09/09/11 12:29 AM      Component Value Range Comment   Sodium 139  135 - 145 mEq/L    Potassium 3.7  3.5 - 5.1 mEq/L    Chloride 100  96 - 112 mEq/L    CO2 29  19 - 32 mEq/L    Glucose, Bld 81  70 - 99 mg/dL    BUN 15  6 - 23 mg/dL    Creatinine, Ser 7.82  0.50 - 1.35 mg/dL    Calcium 9.6  8.4 - 95.6 mg/dL    GFR calc non Af Amer >90  >90 mL/min    GFR calc Af Amer >90  >90 mL/min    No results found.  Review of Systems  Gastrointestinal: Positive for abdominal pain (left groin inguinal region).  All other systems reviewed and are negative.  Blood pressure 114/70, pulse 60, temperature 97.3 F (36.3 C), temperature source Oral, resp. rate 18, SpO2 100.00%. Physical Exam  Constitutional: He is oriented to person, place, and time. He appears well-developed and well-nourished.  HENT:  Head: Normocephalic.  Eyes: Conjunctivae and EOM are normal. Pupils are equal, round, and reactive to light.  Neck: Normal range of motion. Neck supple.  Cardiovascular: Normal rate and regular rhythm.   Respiratory: Effort normal and breath sounds normal.  GI: Soft. Bowel sounds are normal. A hernia is present. Hernia confirmed positive in the left inguinal area.    Genitourinary: Penis normal.  Musculoskeletal: Normal range of motion.  Neurological: He is alert and oriented to person, place, and time. He has normal reflexes.  Skin: Skin is warm and dry.  Psychiatric: He has a normal mood and affect. His behavior is normal. Judgment and thought content normal.     Assessment/Plan Incarcerated left inguingal hernia  OR for Open repair and reduction.  Cherylynn Ridges 09/09/2011, 4:14 AM

## 2011-09-09 NOTE — ED Notes (Signed)
Patient states that he has not had any relief in pain.
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
## 2011-09-10 ENCOUNTER — Emergency Department (HOSPITAL_COMMUNITY)
Admission: EM | Admit: 2011-09-10 | Discharge: 2011-09-10 | Disposition: A | Discharge status: Home / Self Care | Payer: Self-pay | Source: Ambulatory Visit | Attending: Emergency Medicine | Admitting: Emergency Medicine

## 2011-09-10 ENCOUNTER — Emergency Department (HOSPITAL_COMMUNITY): Payer: Self-pay

## 2011-09-10 DIAGNOSIS — G8918 Other acute postprocedural pain: Secondary | ICD-10-CM

## 2011-09-10 MED ORDER — HYDROMORPHONE HCL PF 1 MG/ML IJ SOLN
1.0000 mg | Freq: Once | INTRAMUSCULAR | Status: AC
  Administered 2011-09-10: 1 mg via INTRAMUSCULAR
  Filled 2011-09-10: qty 1

## 2011-09-10 MED ORDER — OXYCODONE-ACETAMINOPHEN 5-325 MG PO TABS: 1.0000 | ORAL_TABLET | Freq: Four times a day (QID) | ORAL | Status: AC | PRN

## 2011-09-10 NOTE — ED Notes (Signed)
Scrotal edema tender to palpation. Incision approximated to L groin

## 2011-09-10 NOTE — Discharge Instructions (Signed)
Your pain is likely related to the surgery. Return for fevers. Follow with Dr. Lindie Spruce

## 2011-09-10 NOTE — Discharge Summary (Signed)
Trevor Clark. Corliss Skains, MD, [facility]  09/10/2011 7:23 AM

## 2011-09-10 NOTE — ED Notes (Addendum)
Patient transported to Ultrasound 

## 2011-09-10 NOTE — ED Provider Notes (Signed)
History     CSN: [id number]  Arrival date & time 09/09/11  2152   First MD Initiated Contact with Patient 09/10/11 0033      Chief Complaint  Patient presents with  . Groin Swelling    (Consider location/radiation/quality/duration/timing/severity/associated sxs/prior treatment) The history is provided by the patient.   Patient had a repair yesterday for a incarcerated indirect inguinal hernia on the left side. He states he went home around 3 hours ago is now had increased pain and swelling in his left groin. He states it also hurts to urinate. He states surgery went well. New fall or injury. He states his testicle hurts. Past Medical History  Diagnosis Date  . Anginal pain   . Hematuria - cause not known 2012    "dr told me this when I went to have my physical"    Past Surgical History  Procedure Date  . Inguinal hernia repair 09/08/11    incarcerated left w/omentum    No family history on file.  History  Substance Use Topics  . Smoking status: Current Everyday Smoker -- 12 years    Types: Cigars  . Smokeless tobacco: Never Used   Comment: "smoke a cigar a day"  . Alcohol Use: 0.6 oz/week    1 Cans of beer per week     09/09/11 "I'm a social drinker"      Review of Systems  Constitutional: Negative for fever and chills.  Respiratory: Negative for chest tightness.   Cardiovascular: Negative for chest pain.  Gastrointestinal: Positive for abdominal pain.  Genitourinary: Positive for scrotal swelling and testicular pain. Negative for flank pain and penile swelling.  Neurological: Negative for weakness and numbness.    Allergies  Review of patient's allergies indicates no known allergies.  Home Medications   Current Outpatient Rx  Name Route Sig Dispense Refill  . CYCLOBENZAPRINE HCL 5 MG PO TABS Oral Take 5 mg by mouth once.    . OXYCODONE-ACETAMINOPHEN 5-325 MG PO TABS Oral Take 1-2 tablets by mouth every 4 (four) hours as needed for pain. 30 tablet 0  .  OXYCODONE-ACETAMINOPHEN 5-325 MG PO TABS Oral Take 1-2 tablets by mouth every 6 (six) hours as needed for pain. 20 tablet 0    BP 112/57  Pulse 53  Temp 98.3 F (36.8 C) (Oral)  Resp 16  SpO2 100%  Physical Exam  Constitutional: He appears well-developed and well-nourished.       Patient appears uncomfortable  HENT:  Head: Normocephalic.  Pulmonary/Chest: No respiratory distress.  Abdominal: There is tenderness.       Tenderness suprapubic left lower quadrant. Postsurgical dressing to his left inguinal area.  Genitourinary: Penis normal.       Fullness of left hemiscrotum. Testicle palpated nontender. Fullness superior to testicle. No clear hernia palpated.  Musculoskeletal: He exhibits no edema.  Neurological: He is alert.    ED Course  Procedures (including critical care time)  Labs Reviewed - No data to display US Scrotum  09/10/2011  *RADIOLOGY REPORT*  Clinical Data:  Swelling.  Previous left hernia repair.  SCROTAL ULTRASOUND DOPPLER ULTRASOUND OF THE TESTICLES  Technique: Complete ultrasound examination of the testicles, epididymis, and other scrotal structures was performed.  Color and spectral Doppler ultrasound were also utilized to evaluate blood flow to the testicles.  Comparison:  None  Findings:  Right testis:  The right testis measures 4.1 x 2.5 x 2.6 cm. Normal homogeneous parenchymal echotexture without focal mass lesion.  Normal symmetrical flow throughout  the testis on color flow Doppler imaging.  Left testis:  Left testis measures 4.1 x 2.5 x 2.6 cm.  Normal homogeneous parenchymal echotexture without focal mass lesion. Normal symmetrical flow throughout the testis on color flow Doppler imaging.  Right epididymis:  Normal in size and appearance.  Left epididymis:  Normal in size and appearance.  Hydocele:  No hydroceles demonstrated.  Varicocele:  No varicoceles demonstrated.  Pulsed Doppler interrogation of both testes demonstrates low resistance flow bilaterally.  Normal arterial and venous wave forms are demonstrated in both testes.  IMPRESSION: Normal and symmetrical appearance of the testes and epididymides bilaterally.  Normal flow signal.  No evidence of testicular mass or torsion.  Original Report Authenticated By: Marlon Pel, M.D.   [place] Art/ven Flow Abd Pelv Doppler  09/10/2011  *RADIOLOGY REPORT*  Clinical Data:  Swelling.  Previous left hernia repair.  SCROTAL ULTRASOUND DOPPLER ULTRASOUND OF THE TESTICLES  Technique: Complete ultrasound examination of the testicles, epididymis, and other scrotal structures was performed.  Color and spectral Doppler ultrasound were also utilized to evaluate blood flow to the testicles.  Comparison:  None  Findings:  Right testis:  The right testis measures 4.1 x 2.5 x 2.6 cm. Normal homogeneous parenchymal echotexture without focal mass lesion.  Normal symmetrical flow throughout the testis on color flow Doppler imaging.  Left testis:  Left testis measures 4.1 x 2.5 x 2.6 cm.  Normal homogeneous parenchymal echotexture without focal mass lesion. Normal symmetrical flow throughout the testis on color flow Doppler imaging.  Right epididymis:  Normal in size and appearance.  Left epididymis:  Normal in size and appearance.  Hydocele:  No hydroceles demonstrated.  Varicocele:  No varicoceles demonstrated.  Pulsed Doppler interrogation of both testes demonstrates low resistance flow bilaterally. Normal arterial and venous wave forms are demonstrated in both testes.  IMPRESSION: Normal and symmetrical appearance of the testes and epididymides bilaterally.  Normal flow signal.  No evidence of testicular mass or torsion.  Original Report Authenticated By: Marlon Pel, M.D.     1. Post-operative pain       MDM  Patient with left groin and testicle pain 1 day post indirect inguinal hernia repair. Afebrile. There is some mild swelling of hemiscrotum. Negative ultrasound. After discussion with Dr. Luisa Hart general  surgery patient be discharged home to followup with Dr. Lindie Spruce.        Juliet Rude. Rubin Payor, MD 09/10/11 [phone]

## 2011-09-11 ENCOUNTER — Encounter (HOSPITAL_COMMUNITY): Payer: Self-pay | Admitting: General Surgery

## 2011-10-03 ENCOUNTER — Encounter (INDEPENDENT_AMBULATORY_CARE_PROVIDER_SITE_OTHER): Payer: Self-pay | Admitting: General Surgery

## 2011-10-06 ENCOUNTER — Encounter (INDEPENDENT_AMBULATORY_CARE_PROVIDER_SITE_OTHER): Payer: Self-pay | Admitting: General Surgery

## 2013-08-12 ENCOUNTER — Ambulatory Visit: Payer: Self-pay

## 2013-11-19 ENCOUNTER — Emergency Department: Payer: Self-pay

## 2013-11-19 ENCOUNTER — Emergency Department
Admission: EM | Admit: 2013-11-19 | Discharge: 2013-11-19 | Disposition: A | Discharge status: Home / Self Care | Payer: Worker's Comp, Other unspecified | Attending: Emergency Medicine | Admitting: Emergency Medicine

## 2013-11-19 DIAGNOSIS — S0086XA Insect bite (nonvenomous) of other part of head, initial encounter: Secondary | ICD-10-CM | POA: Insufficient documentation

## 2013-11-19 DIAGNOSIS — T148 Other injury of unspecified body region: Secondary | ICD-10-CM | POA: Insufficient documentation

## 2013-11-19 DIAGNOSIS — S2096XA Insect bite (nonvenomous) of unspecified parts of thorax, initial encounter: Secondary | ICD-10-CM | POA: Insufficient documentation

## 2013-11-19 DIAGNOSIS — W57XXXA Bitten or stung by nonvenomous insect and other nonvenomous arthropods, initial encounter: Secondary | ICD-10-CM | POA: Insufficient documentation

## 2013-11-19 MED ORDER — PREDNISONE 20 MG PO TABS
20.0000 mg | ORAL_TABLET | Freq: Once | ORAL | Status: AC
Start: 2013-11-19 — End: 2013-11-19
  Administered 2013-11-19: 20 mg via ORAL

## 2013-11-19 MED ORDER — PREDNISONE 20 MG PO TABS
ORAL_TABLET | ORAL | Status: AC
Start: 2013-11-19 — End: ?

## 2013-11-19 MED ORDER — PREDNISONE 20 MG PO TABS
ORAL_TABLET | ORAL | Status: AC
Start: 2013-11-19 — End: ?
  Filled 2013-11-19: qty 1

## 2013-11-19 NOTE — Discharge Instructions (Signed)
Bedbugs  After years of being very rare in the [place], bedbugs are making a comeback. These bugs are small, about the size of an apple seed. They are reddish-brown, oval, and look slightly flattened. Bedbugs feed on human and animal blood, usually at night during sleep. Bedbugs are a nuisance. But they are not a major threat to your health. Read on to learn more about this bug and what to do if you are bitten.    Facts About Bedbugs   Bedbugs are active mainly at night. During the day, they hide in dark places, often in and around where people or animalssleep. They are commonly found on mattresses and boxsprings and behind headboards. But they can hide anywhere.   Bedbugs are small and hard to see. They are often carried from place to place in items like luggage, furniture, and clothing. This is why they spread so easily.   Bedbugs are not attracted to dirt. Even the cleanest house or hotel can have bedbugs.   Unlike mosquitoes, bedbugs do not transmit disease. If you are bitten, you do not have to worry about catching a blood-borne illness.   Insect repellents have little effect on bedbugs.   Adult bedbugs can live forseveral monthswithout a blood feeding.   Bedbugs are very hard to get rid of. If an infestation is suspected, it is recommended that a professional exterminator be called.  Signs of Bedbugs  Bites can be the first sign of a bedbug infestation. When inspecting for the bugs, look in crevices of mattresses and box springs, behind the headboard, and in and on objects near or under the bed. You may see the bugs themselves. Or you may see tiny dark stains on fabric or carpets. Smears of blood on sheets and nightclothes upon awakening are another sign. In some cases, the bugs are so well hidden they cannot be found unless items are taken apart.  Bedbug Bites  Bedbugs look for food at night. They bite while people or animals are sleeping. The bites are most often painless. Many people never know  they've been bitten. However, some people develop an itchy red welt or swelling. And if a person has an allergic reaction, severe itching, blisters, or hives can develop. Bites are often on areas that are exposed, such as the head, neck, arms, and hands. Bedbug bites are not dangerous and don't spread illness. But if the bite is scratched and the skin is broken and irritated, there is a chance that a skin infection can develop.  Treating Bites  Bite symptoms usually go away on their own within a week or two. During this time, over-the-counter (OTC) hydrocortisone ointment or cream can help relieve itching and swelling. If itching is bad, an OTC oral antihistamine (such as Benadryl) can help. If an infection develops from scratching the bites, the healthcare provider can prescribe an antibiotic.  If you were bitten by bedbugs in your home, talk to a licensed pest-control professional or company. They can inspect your home and and help you get rid of the bugs safely.  When to Call the Doctor   If you have bites, call your doctor if you develop any of the following:   A fever of 100.4F (38C) or higher   Signs of infection of the bites, such as increased swelling and pain, warmth, or oozing   Signs of allergic reaction, such as hives, spreading rash, throat itching or swelling, or wheezing   Avoiding Bedbugs   Avoid buying used beds,   but if you do buy used bed frames, mattresses, box springs, or other furniture, be sure to check them carefully for bedbugs before bringing them into your home.   If bed bugs are found or suspected in the bed, using bed-bug excluding mattress and boxspring encasement covers can seal them in where they will eventually die.   When traveling, remove linens from the top of the bed and inspect the mattress and headboard for signs of the bugs. Place luggage on a hard surface such as a table or on a luggage rack and not on the floor.   If you suspect you were exposed to bedbugs while  traveling, wash all clothing in hot water directly upon your return. Put non-washable items in the dryer on high heat for 30 minutes.   Never pick up items discarded on the street-such as bed frames, mattresses, box springs, or upholstered furniture-for use in your home. These items may carry bedbugs.   2000-2014 [employer] [address], [place] [address]. All rights reserved. This information is not intended as a substitute for professional medical care. Always follow your healthcare professional's instructions.
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
## 2014-06-30 ENCOUNTER — Encounter (HOSPITAL_COMMUNITY): Payer: Self-pay | Admitting: *Deleted

## 2014-06-30 ENCOUNTER — Emergency Department (HOSPITAL_COMMUNITY)
Admission: EM | Admit: 2014-06-30 | Discharge: 2014-06-30 | Disposition: A | Discharge status: Home / Self Care | Payer: Medicaid Other | Attending: Emergency Medicine | Admitting: Emergency Medicine

## 2014-06-30 DIAGNOSIS — R319 Hematuria, unspecified: Secondary | ICD-10-CM | POA: Insufficient documentation

## 2014-06-30 DIAGNOSIS — I209 Angina pectoris, unspecified: Secondary | ICD-10-CM | POA: Insufficient documentation

## 2014-06-30 DIAGNOSIS — Z720 Tobacco use: Secondary | ICD-10-CM | POA: Insufficient documentation

## 2014-06-30 DIAGNOSIS — Z79899 Other long term (current) drug therapy: Secondary | ICD-10-CM | POA: Diagnosis not present

## 2014-06-30 LAB — URINALYSIS, ROUTINE W REFLEX MICROSCOPIC
BILIRUBIN URINE: NEGATIVE
Glucose, UA: NEGATIVE mg/dL
Ketones, ur: 15 mg/dL — AB
LEUKOCYTES UA: NEGATIVE
Nitrite: NEGATIVE
PROTEIN: NEGATIVE mg/dL
SPECIFIC GRAVITY, URINE: 1.029 (ref 1.005–1.030)
Urobilinogen, UA: 0.2 mg/dL (ref 0.0–1.0)
pH: 6 (ref 5.0–8.0)

## 2014-06-30 LAB — URINE MICROSCOPIC-ADD ON

## 2014-06-30 NOTE — Discharge Instructions (Signed)
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
## 2014-06-30 NOTE — ED Notes (Signed)
Pt went to have work physical done today and was told to come here due to hematuria. Reports occ abd pain and burning pain with urination.

## 2014-07-28 ENCOUNTER — Ambulatory Visit: Payer: Self-pay | Admitting: Family Medicine

## 2015-10-22 ENCOUNTER — Encounter (HOSPITAL_COMMUNITY): Payer: Self-pay | Admitting: Emergency Medicine

## 2015-10-22 DIAGNOSIS — T796XXA Traumatic ischemia of muscle, initial encounter: Secondary | ICD-10-CM | POA: Insufficient documentation

## 2015-10-22 DIAGNOSIS — F17210 Nicotine dependence, cigarettes, uncomplicated: Secondary | ICD-10-CM | POA: Insufficient documentation

## 2015-10-22 NOTE — ED Triage Notes (Signed)
Pt. reports right lower leg pain onset this evening , denies injury / ambulatory , no swelling or deformity.

## 2015-10-23 ENCOUNTER — Emergency Department (HOSPITAL_COMMUNITY)
Admission: EM | Admit: 2015-10-23 | Discharge: 2015-10-23 | Disposition: A | Discharge status: Home / Self Care | Payer: Medicaid Other | Attending: Emergency Medicine | Admitting: Emergency Medicine

## 2015-10-23 DIAGNOSIS — S86891A Other injury of other muscle(s) and tendon(s) at lower leg level, right leg, initial encounter: Secondary | ICD-10-CM

## 2015-10-23 MED ORDER — NAPROXEN 500 MG PO TABS: 500.0000 mg | ORAL_TABLET | Freq: Two times a day (BID) | ORAL | 0 refills | Status: AC

## 2015-10-23 NOTE — ED Provider Notes (Signed)
[facility] Provider Note   CSN: [id number] Arrival date & time: 10/22/15  2235  First Provider Contact:  None       History   Chief Complaint Chief Complaint  Patient presents with  . Leg Pain    HPI Trevor Clark is a 30 y.o. male.  Patient with right lower leg pain, onset earlier today.  Patient is athletically active, with frequent running.  Patient does not recall any specific injury.  He has not tried anything for the pain.    Leg Pain   This is a new problem. The current episode started 12 to 24 hours ago. The problem occurs constantly. The problem has not changed since onset.The pain is present in the right lower leg. The quality of the pain is described as sharp. The pain is moderate. Pertinent negatives include no numbness and full range of motion. The symptoms are aggravated by activity. He has tried nothing for the symptoms. There has been no history of extremity trauma.    Past Medical History:  Diagnosis Date  . Anginal pain (HCC)   . Hematuria - cause not known 2012   "dr told me this when I went to have my physical"    There are no active problems to display for this patient.   Past Surgical History:  Procedure Laterality Date  . INGUINAL HERNIA REPAIR  09/08/11   incarcerated left w/omentum  . INGUINAL HERNIA REPAIR  09/09/2011   Procedure: HERNIA REPAIR INGUINAL ADULT;  Surgeon: Cherylynn RidgesJames O Wyatt, MD;  [facility];  Service: General;  Laterality: Left;       Home Medications    Prior to Admission medications   Medication Sig Start Date End Date Taking? Authorizing Provider  Multiple Vitamin (MULTIVITAMIN WITH MINERALS) TABS tablet Take 1 tablet by mouth daily.    Historical Provider, MD    Family History No family history on file.  Social History Social History  Substance Use Topics  . Smoking status: Current Every Day Smoker    Years: 12.00    Types: Cigars  . Smokeless tobacco: Never Used     Comment: "smoke a cigar a day"  .  Alcohol use Yes     Allergies   Review of patient's allergies indicates no known allergies.   Review of Systems Review of Systems  Musculoskeletal: Positive for arthralgias.  Neurological: Negative for numbness.  All other systems reviewed and are negative.    Physical Exam Updated Vital Signs BP 120/69 (BP Location: Left Arm)   Pulse 62   Temp 98.2 F (36.8 C) (Oral)   Resp 16   SpO2 99%   Physical Exam  Constitutional: He is oriented to person, place, and time. He appears well-developed and well-nourished.  HENT:  Head: Normocephalic.  Eyes: Pupils are equal, round, and reactive to light.  Neck: Neck supple.  Cardiovascular: Normal rate and regular rhythm.   Pulmonary/Chest: Effort normal and breath sounds normal.  Abdominal: Soft. Bowel sounds are normal.  Musculoskeletal: Normal range of motion. He exhibits tenderness.  Tenderness to palpation along tibial ridge from knee to ankle.  No erythema or swelling noted.  Lymphadenopathy:    He has no cervical adenopathy.  Neurological: He is alert and oriented to person, place, and time.  Skin: Skin is warm and dry.  Nursing note and vitals reviewed.    ED Treatments / Results  Labs (all labs ordered are listed, but only abnormal results are displayed) Labs Reviewed - No data to  display  EKG  EKG Interpretation None       Radiology No results found.  Procedures Procedures (including critical care time)  Medications Ordered in ED Medications - No data to display   Initial Impression / Assessment and Plan / ED Course  I have reviewed the triage vital signs and the nursing notes.  Pertinent labs & imaging results that were available during my care of the patient were reviewed by me and considered in my medical decision making (see chart for details).  Clinical Course   Patient with symptoms consistent with shin splints.Conservative therapy recommended and discussed. Pt advised to follow up with his  PCP. Patient will be discharged home & is agreeable with above plan. Returns precautions discussed. Pt appears safe for discharge.   Final Clinical Impressions(s) / ED Diagnoses   Final diagnoses:  Medial tibial stress syndrome, right, initial encounter    New Prescriptions New Prescriptions   No medications on file     Felicie Morn, NP 10/23/15 0122    Tomasita Crumble, MD 10/23/15 [phone]

## 2020-09-19 ENCOUNTER — Other Ambulatory Visit: Payer: Self-pay

## 2020-09-19 ENCOUNTER — Encounter (HOSPITAL_COMMUNITY): Payer: Self-pay

## 2020-09-19 ENCOUNTER — Ambulatory Visit (HOSPITAL_COMMUNITY)
Admission: EM | Admit: 2020-09-19 | Discharge: 2020-09-19 | Disposition: A | Discharge status: Home / Self Care | Payer: Self-pay | Attending: Physician Assistant | Admitting: Physician Assistant

## 2020-09-19 DIAGNOSIS — Z23 Encounter for immunization: Secondary | ICD-10-CM

## 2020-09-19 DIAGNOSIS — S61411A Laceration without foreign body of right hand, initial encounter: Secondary | ICD-10-CM

## 2020-09-19 DIAGNOSIS — L089 Local infection of the skin and subcutaneous tissue, unspecified: Secondary | ICD-10-CM

## 2020-09-19 MED ORDER — TETANUS-DIPHTH-ACELL PERTUSSIS 5-2.5-18.5 LF-MCG/0.5 IM SUSY
PREFILLED_SYRINGE | INTRAMUSCULAR | Status: AC
  Filled 2020-09-19: qty 0.5

## 2020-09-19 MED ORDER — TETANUS-DIPHTH-ACELL PERTUSSIS 5-2.5-18.5 LF-MCG/0.5 IM SUSY
0.5000 mL | PREFILLED_SYRINGE | Freq: Once | INTRAMUSCULAR | Status: AC
  Administered 2020-09-19: 0.5 mL via INTRAMUSCULAR

## 2020-09-19 MED ORDER — DOXYCYCLINE HYCLATE 100 MG PO CAPS: 100.0000 mg | ORAL_CAPSULE | Freq: Two times a day (BID) | ORAL | 0 refills | Status: DC

## 2020-09-19 NOTE — ED Triage Notes (Signed)
Pt presents with a right hand laceration.   States he was moving an [facility] unit 3 days ago.   Pt states he did not realize he got a cut until later.

## 2020-09-19 NOTE — Discharge Instructions (Addendum)
Wound was not sutured since wound has been present for several days and it does not come together well.  Please keep it clean and use alcohol to clean it.  You can use topical antibiotic ointment if necessary.  Given the swelling and tenderness I am going to call in an antibiotic.  Take doxycycline twice daily for 10 days.  Stay out of the sun while on this medication as it can give you a bad sunburn.  Your tetanus was updated today.  If you have any worsening symptoms you need to be reevaluated immediately.

## 2020-09-19 NOTE — ED Provider Notes (Signed)
[facility]    CSN: [id number] Arrival date & time: 09/19/20  1802      History   Chief Complaint Chief Complaint  Patient presents with   Extremity Laceration    HPI Trevor Clark is a 35 y.o. male.   Patient presents today with a laceration to his right hand that occurred approximately 3 days ago.  Reports that he was moving an [facility] unit when he lost his balance and this bumped into his right hand causing a laceration.  He has no concern for foreign body or retained metallic object.  His primary concern today is area has become swollen and slightly tender.  He denies any fever, bleeding, drainage.  He has been cleaning area with hydrogen peroxide and warm water and soap.  He denies any recent antibiotic use.  Denies history of skin infection including MRSA.  He is unsure when his last tetanus was.  He is left-handed.  He has been able to perform daily duties despite symptoms.  He reports a mild amount of numbness surrounding wound but otherwise denies numbness, tingling, weakness, decreased range of motion.   Past Medical History:  Diagnosis Date   Anginal pain (HCC)    Hematuria - cause not known 2012   "dr told me this when I went to have my physical"    There are no problems to display for this patient.   Past Surgical History:  Procedure Laterality Date   INGUINAL HERNIA REPAIR  09/08/11   incarcerated left w/omentum   INGUINAL HERNIA REPAIR  09/09/2011   Procedure: HERNIA REPAIR INGUINAL ADULT;  Surgeon: Cherylynn Ridges, MD;  Location: [facility] OR;  Service: General;  Laterality: Left;       Home Medications    Prior to Admission medications   Medication Sig Start Date End Date Taking? Authorizing Provider  doxycycline (VIBRAMYCIN) 100 MG capsule Take 1 capsule (100 mg total) by mouth 2 (two) times daily. 09/19/20  Yes Tanysha Quant, Noberto Retort, PA-C  Multiple Vitamin (MULTIVITAMIN WITH MINERALS) TABS tablet Take 1 tablet by mouth daily.    [provider]  naproxen  (NAPROSYN) 500 MG tablet Take 1 tablet (500 mg total) by mouth 2 (two) times daily. 10/23/15   Felicie Morn, NP    Family History History reviewed. No pertinent family history.  Social History Social History   Tobacco Use   Smoking status: Every Day    Pack years: 0.00    Types: Cigars   Smokeless tobacco: Never   Tobacco comments:    "smoke a cigar a day"  Substance Use Topics   Alcohol use: Yes   Drug use: Yes    Types: Marijuana    Comment: 09/09/11 "last marijuana ~ 1 wk ago"     Allergies   Patient has no allergy information on record.   Review of Systems Review of Systems  Constitutional:  Negative for activity change, appetite change, fatigue and fever.  Respiratory:  Negative for cough and shortness of breath.   Cardiovascular:  Negative for chest pain.  Gastrointestinal:  Negative for abdominal pain, diarrhea, nausea and vomiting.  Musculoskeletal:  Negative for arthralgias and myalgias.  Skin:  Positive for wound. Negative for color change.  Neurological:  Negative for dizziness, weakness, light-headedness, numbness and headaches.    Physical Exam Triage Vital Signs ED Triage Vitals  Enc Vitals Group     BP 09/19/20 1818 114/63     Pulse Rate 09/19/20 1818 (!) 59  Resp 09/19/20 1818 18     Temp 09/19/20 1818 98.1 F (36.7 C)     Temp Source 09/19/20 1818 Oral     SpO2 09/19/20 1818 97 %     Weight --      Height --      Head Circumference --      Peak Flow --      Pain Score 09/19/20 1816 0     Pain Loc --      Pain Edu? --      Excl. in [place]? --    No data found.  Updated Vital Signs BP 114/63 (BP Location: Left Arm)   Pulse (!) 59   Temp 98.1 F (36.7 C) (Oral)   Resp 18   SpO2 97%   Visual Acuity Right Eye Distance:   Left Eye Distance:   Bilateral Distance:    Right Eye Near:   Left Eye Near:    Bilateral Near:     Physical Exam Vitals reviewed.  Constitutional:      General: He is awake.     Appearance: Normal appearance.  He is normal weight. He is not ill-appearing.     Comments: Very pleasant male appears stated age in no acute distress  HENT:     Head: Normocephalic and atraumatic.  Cardiovascular:     Rate and Rhythm: Normal rate and regular rhythm.     Pulses:          Radial pulses are 2+ on the right side and 2+ on the left side.     Heart sounds: Normal heart sounds, S1 normal and S2 normal. No murmur heard.    Comments: Capillary fill within 2 seconds Pulmonary:     Effort: Pulmonary effort is normal.     Breath sounds: Normal breath sounds. No stridor. No wheezing, rhonchi or rales.     Comments: Clear to auscultation bilaterally Musculoskeletal:     Comments: Right hand neurovascularly intact.  Normal two-point discrimination.  Normal active range of motion.  Skin:    Comments: 3 cm laceration noted dorsal right hand at the base of first MCP.  No active bleeding or drainage noted.  Small eschar noted at lateral edges removed with saline irrigation revealing normal-appearing granular tissue.  Mild erythema and swelling surrounding laceration without streaking or evidence of lymphangitis.  Neurological:     Mental Status: He is alert.  Psychiatric:        Behavior: Behavior is cooperative.     UC Treatments / Results  Labs (all labs ordered are listed, but only abnormal results are displayed) Labs Reviewed - No data to display  EKG   Radiology No results found.  Procedures Procedures (including critical care time)  Medications Ordered in UC Medications  Tdap (BOOSTRIX) injection 0.5 mL (has no administration in time range)    Initial Impression / Assessment and Plan / UC Course  I have reviewed the triage vital signs and the nursing notes.  Pertinent labs & imaging results that were available during my care of the patient were reviewed by me and considered in my medical decision making (see chart for details).    Discussed that we are unable to suture lesion given it has been  more than 24 hours in wound does not approximate well.  Area was debrided and cleaned with Hibiclens and irrigated with normal saline.  No foreign body appreciated and patient declined x-ray.  Due to concerning redness and swelling for infection we will  start patient on doxycycline.  He was instructed to avoid prolonged sun exposure due to photosensitivity associate with this medication.  Tetanus was updated today.  Encouraged him to clean wound with alcohol and apply antibiotic ointment.  Discussed alarm symptoms that warrant emergent evaluation.  Strict return precautions given to which patient expressed understanding. Final Clinical Impressions(s) / UC Diagnoses   Final diagnoses:  Laceration of right hand without foreign body, initial encounter  Wound infection     Discharge Instructions      Wound was sutured laceration symptoms that has been present for several days and it does not come together well.  Please keep it clean and use alcohol to clean it.  You can use topical antibiotic ointment if necessary.  Given the swelling and tenderness I am going to call in an antibiotic.  Take doxycycline twice daily for 10 days.  Stay out of the sun while on this medication as it can give you a bad sunburn.  Your tetanus was updated today.  If you have any worsening symptoms you need to be reevaluated immediately.     ED Prescriptions     Medication Sig Dispense Auth. Provider   doxycycline (VIBRAMYCIN) 100 MG capsule Take 1 capsule (100 mg total) by mouth 2 (two) times daily. 20 capsule Judi Jaffe, Noberto Retort, PA-C      PDMP not reviewed this encounter.   Jeani Hawking, PA-C 09/19/20 2018

## 2021-01-17 ENCOUNTER — Ambulatory Visit (HOSPITAL_COMMUNITY)
Admission: RE | Admit: 2021-01-17 | Discharge: 2021-01-17 | Disposition: A | Discharge status: Home / Self Care | Payer: Self-pay | Source: Ambulatory Visit | Attending: Internal Medicine | Admitting: Internal Medicine

## 2021-01-17 ENCOUNTER — Other Ambulatory Visit: Payer: Self-pay

## 2021-01-17 ENCOUNTER — Encounter (HOSPITAL_COMMUNITY): Payer: Self-pay

## 2021-01-17 VITALS — BP 119/83 | HR 105 | Temp 98.1°F | Resp 18

## 2021-01-17 DIAGNOSIS — S90121A Contusion of right lesser toe(s) without damage to nail, initial encounter: Secondary | ICD-10-CM

## 2021-01-17 MED ORDER — BACITRACIN ZINC 500 UNIT/GM EX OINT: 1.0000 "application " | TOPICAL_OINTMENT | Freq: Two times a day (BID) | CUTANEOUS | 0 refills | Status: AC

## 2021-01-17 NOTE — ED Triage Notes (Signed)
Patient reports 1 1/2 weeks ago, wife stepped on his right great toe.  Right great toenail is disrupted at base of nail and toe nail is discolored.  There is a bruise at base of toenail.

## 2021-01-17 NOTE — Discharge Instructions (Addendum)
Antibiotic ointment please apply antibiotic ointment twice a day to the affected area Take antibiotics ibuprofen as needed for pain Warm salt water soaks If you notice swelling, purulent discharge, redness please return to urgent care to be reevaluated.

## 2021-01-20 NOTE — ED Provider Notes (Addendum)
[place]    CSN: [id number] Arrival date & time: 01/17/21  1842      History   Chief Complaint Chief Complaint  Patient presents with   Appointment    7:00 pm   Toe Injury    HPI Trevor Clark is a 35 y.o. male comes to the urgent care with right great toe pain of 2 weeks duration.  Patient's wife accidentally stepped on his right toe resulting in an injury to the right toenail.  Patient initially had a blood blister at the base of the right toenail.  The blood blister ruptured and bled.  Bleeding is controlled.  Swelling is improved.  Patient comes to urgent care to be evaluated.Marland Kitchen   HPI  Past Medical History:  Diagnosis Date   Anginal pain ([facility])    Hematuria - cause not known 2012   "dr told me this when I went to have my physical"    There are no problems to display for this patient.   Past Surgical History:  Procedure Laterality Date   INGUINAL HERNIA REPAIR  09/08/11   incarcerated left w/omentum   INGUINAL HERNIA REPAIR  09/09/2011   Procedure: HERNIA REPAIR INGUINAL ADULT;  Surgeon: Gwenyth Ober, MD;  Location: [place];  Service: General;  Laterality: Left;       Home Medications    Prior to Admission medications   Medication Sig Start Date End Date Taking? Authorizing Provider  bacitracin ointment Apply 1 application topically 2 (two) times daily. 01/17/21  Yes Viona Hosking, Myrene Galas, MD  Multiple Vitamin (MULTIVITAMIN WITH MINERALS) TABS tablet Take 1 tablet by mouth daily.    [provider]  naproxen (NAPROSYN) 500 MG tablet Take 1 tablet (500 mg total) by mouth 2 (two) times daily. 10/23/15   Etta Quill, NP    Family History Family History  Problem Relation Age of Onset   Hyperlipidemia Mother    Hyperlipidemia Father     Social History Social History   Tobacco Use   Smoking status: Every Day    Types: Cigars   Smokeless tobacco: Never   Tobacco comments:    "smoke a cigar a day"  Vaping Use   Vaping Use: Never used   Substance Use Topics   Alcohol use: Yes    Comment: rare   Drug use: Yes    Types: Marijuana    Comment: 09/09/11 "last marijuana ~ 1 wk ago"     Allergies   Patient has no known allergies.   Review of Systems Review of Systems  Musculoskeletal:  Positive for arthralgias. Negative for myalgias.  Skin:  Positive for wound. Negative for color change, pallor and rash.    Physical Exam Triage Vital Signs ED Triage Vitals  Enc Vitals Group     BP 01/17/21 1950 119/83     Pulse Rate 01/17/21 1950 (!) 105     Resp 01/17/21 1950 18     Temp 01/17/21 1950 98.1 F (36.7 C)     Temp Source 01/17/21 1950 Oral     SpO2 01/17/21 1950 100 %     Weight --      Height --      Head Circumference --      Peak Flow --      Pain Score 01/17/21 1948 0     Pain Loc --      Pain Edu? --      Excl. in [place]? --    No data found.  Updated Vital Signs BP 119/83 (BP Location: Left Arm)   Pulse (!) 105   Temp 98.1 F (36.7 C) (Oral)   Resp 18   SpO2 100%   Visual Acuity Right Eye Distance:   Left Eye Distance:   Bilateral Distance:    Right Eye Near:   Left Eye Near:    Bilateral Near:     Physical Exam Vitals and nursing note reviewed.  Constitutional:      General: He is not in acute distress.    Appearance: He is not ill-appearing.  Musculoskeletal:        General: Normal range of motion.     Comments: Right great toe nail in place.  Bruising at the base of the toenail.  No fluctuance.  Mild tenderness on palpation.  No erythema of the right great toe.  Neurological:     Mental Status: He is alert.     UC Treatments / Results  Labs (all labs ordered are listed, but only abnormal results are displayed) Labs Reviewed - No data to display  EKG   Radiology No results found.  Procedures Procedures (including critical care time)  Medications Ordered in UC Medications - No data to display  Initial Impression / Assessment and Plan / UC Course  I have reviewed the  triage vital signs and the nursing notes.  Pertinent labs & imaging results that were available during my care of the patient were reviewed by me and considered in my medical decision making (see chart for details).     1.  Hematoma at the base of the right great toenail: Antibiotic ointment dressing twice daily Left erythema is noted.  Return to return precautions given Warm salt water soaks Avoid occlusive dressing when at home or when wearing open toe slippers Final Clinical Impressions(s) / UC Diagnoses   Final diagnoses:  Toe hematoma, right, initial encounter     Discharge Instructions      Antibiotic ointment please apply antibiotic ointment twice a day to the affected area Take antibiotics ibuprofen as needed for pain Warm salt water soaks If you notice swelling, purulent discharge, redness please return to urgent care to be reevaluated.   ED Prescriptions     Medication Sig Dispense Auth. Provider   bacitracin ointment Apply 1 application topically 2 (two) times daily. 120 g Baer Hinton, Britta Mccreedy, MD      PDMP not reviewed this encounter.   Merrilee Jansky, MD 01/20/21 1516    Merrilee Jansky, MD 01/20/21 [phone]

## 2021-03-19 ENCOUNTER — Emergency Department (HOSPITAL_COMMUNITY)
Admission: EM | Admit: 2021-03-19 | Discharge: 2021-03-20 | Disposition: A | Discharge status: Home / Self Care | Payer: Self-pay | Attending: Emergency Medicine | Admitting: Emergency Medicine

## 2021-03-19 ENCOUNTER — Other Ambulatory Visit: Payer: Self-pay

## 2021-03-19 DIAGNOSIS — R109 Unspecified abdominal pain: Secondary | ICD-10-CM | POA: Insufficient documentation

## 2021-03-19 NOTE — ED Triage Notes (Signed)
Pt c/o right flank pain started 2 months ago. Per patient pain get worsen tonight. Pt denies N/V/D. PT denies fever. Pt a/ox4.

## 2021-03-19 NOTE — ED Notes (Signed)
Save blue and dark green in main lab ?

## 2021-03-20 ENCOUNTER — Emergency Department (HOSPITAL_COMMUNITY): Payer: Self-pay

## 2021-03-20 LAB — CBC
HCT: 41.1 % (ref 39.0–52.0)
Hemoglobin: 13.9 g/dL (ref 13.0–17.0)
MCH: 29.2 pg (ref 26.0–34.0)
MCHC: 33.8 g/dL (ref 30.0–36.0)
MCV: 86.3 fL (ref 80.0–100.0)
Platelets: 258 10*3/uL (ref 150–400)
RBC: 4.76 MIL/uL (ref 4.22–5.81)
RDW: 12.3 % (ref 11.5–15.5)
WBC: 8.3 10*3/uL (ref 4.0–10.5)
nRBC: 0 % (ref 0.0–0.2)

## 2021-03-20 LAB — BASIC METABOLIC PANEL
Anion gap: 6 (ref 5–15)
BUN: 13 mg/dL (ref 6–20)
CO2: 27 mmol/L (ref 22–32)
Calcium: 9.5 mg/dL (ref 8.9–10.3)
Chloride: 104 mmol/L (ref 98–111)
Creatinine, Ser: 1.09 mg/dL (ref 0.61–1.24)
GFR, Estimated: >60 mL/min (ref >60)
Glucose, Bld: 98 mg/dL (ref 70–99)
Potassium: 3.8 mmol/L (ref 3.5–5.1)
Sodium: 137 mmol/L (ref 135–145)

## 2021-03-20 LAB — URINALYSIS, ROUTINE W REFLEX MICROSCOPIC
Bilirubin Urine: NEGATIVE
Glucose, UA: NEGATIVE mg/dL
Hgb urine dipstick: NEGATIVE
Ketones, ur: NEGATIVE mg/dL
Leukocytes,Ua: NEGATIVE
Nitrite: NEGATIVE
Protein, ur: NEGATIVE mg/dL
Specific Gravity, Urine: 1.029 (ref 1.005–1.030)
pH: 5 (ref 5.0–8.0)

## 2021-03-20 LAB — HEPATIC FUNCTION PANEL
ALT: 26 U/L (ref 0–44)
AST: 30 U/L (ref 15–41)
Albumin: 4.6 g/dL (ref 3.5–5.0)
Alkaline Phosphatase: 44 U/L (ref 38–126)
Bilirubin, Direct: 0.1 mg/dL (ref 0.0–0.2)
Indirect Bilirubin: 1.3 mg/dL — ABNORMAL HIGH (ref 0.3–0.9)
Total Bilirubin: 1.4 mg/dL — ABNORMAL HIGH (ref 0.3–1.2)
Total Protein: 7.5 g/dL (ref 6.5–8.1)

## 2021-03-20 LAB — LIPASE, BLOOD: Lipase: 26 U/L (ref 11–51)

## 2021-03-20 MED ORDER — IOHEXOL 350 MG/ML SOLN
80.0000 mL | Freq: Once | INTRAVENOUS | Status: AC | PRN
  Administered 2021-03-20: 80 mL via INTRAVENOUS

## 2021-03-20 NOTE — ED Provider Notes (Signed)
[facility]EMERGENCY DEPT Provider Note   CSN: [id number] Arrival date & time: 03/19/21  2147     History  Chief Complaint  Patient presents with   Flank Pain    Trevor Clark is a 36 y.o. male.   Flank Pain Associated symptoms include abdominal pain. Patient with right-sided flank/abdominal pain.  Has had for the last couple months.  Comes and goes.  States he will feel gassy at times.  States that he will sometimes pass gas and pain will feel better.  No nausea or vomiting.  No weight loss.  States sometimes he will have some urination that will feel little different to.  No blood in the urine.  No nausea or vomiting.  No weight loss.  Not had pain like this before.  States the pain feels as if it is in the upper abdomen.  Feels like it is sometimes below the ribs.     Home Medications Prior to Admission medications   Medication Sig Start Date End Date Taking? Authorizing Provider  bacitracin ointment Apply 1 application topically 2 (two) times daily. 01/17/21   LampteyBritta Mccreedy, MD  Multiple Vitamin (MULTIVITAMIN WITH MINERALS) TABS tablet Take 1 tablet by mouth daily.    [provider]  naproxen (NAPROSYN) 500 MG tablet Take 1 tablet (500 mg total) by mouth 2 (two) times daily. 10/23/15   Felicie Morn, NP      Allergies    Patient has no known allergies.    Review of Systems   Review of Systems  Constitutional:  Negative for appetite change.  Gastrointestinal:  Positive for abdominal pain.  Genitourinary:  Positive for flank pain.  Musculoskeletal:  Negative for back pain.  Neurological:  Negative for weakness.  Psychiatric/Behavioral:  Negative for confusion.    Physical Exam Updated Vital Signs BP 117/67 (BP Location: Left Arm)    Pulse 62    Temp 97.6 F (36.4 C) (Oral)    Resp 18    SpO2 100%  Physical Exam Vitals and nursing note reviewed.  Musculoskeletal:        General: No tenderness.  Skin:    General: Skin is warm.      Capillary Refill: Capillary refill takes less than 2 seconds.  Neurological:     Mental Status: He is alert and oriented to person, place, and time.    ED Results / Procedures / Treatments   Labs (all labs ordered are listed, but only abnormal results are displayed) Labs Reviewed  URINALYSIS, ROUTINE W REFLEX MICROSCOPIC - Abnormal; Notable for the following components:      Result Value   APPearance HAZY (*)    All other components within normal limits  HEPATIC FUNCTION PANEL - Abnormal; Notable for the following components:   Total Bilirubin 1.4 (*)    Indirect Bilirubin 1.3 (*)    All other components within normal limits  BASIC METABOLIC PANEL  CBC  LIPASE, BLOOD    EKG None  Radiology CT ABDOMEN PELVIS W CONTRAST  Result Date: 03/20/2021 CLINICAL DATA:  Abdominal pain EXAM: CT ABDOMEN AND PELVIS WITH CONTRAST TECHNIQUE: Multidetector CT imaging of the abdomen and pelvis was performed using the standard protocol following bolus administration of intravenous contrast. CONTRAST:  31mL OMNIPAQUE IOHEXOL 350 MG/ML SOLN COMPARISON:  03/27/2009 FINDINGS: Lower chest: Unremarkable. Hepatobiliary: Unremarkable. Pancreas: No focal abnormality is seen. Spleen: Unremarkable. Adrenals/Urinary Tract: Adrenals are unremarkable. There is no hydronephrosis. There are no renal or ureteral stones. Urinary bladder is not  distended. There is 1.3 cm smooth marginated low-density lesion in the lower pole of right kidney possibly renal cyst. Stomach/Bowel: Stomach is not distended. Small bowel loops are not dilated. There is fluid in the lumen of small bowel loops. Appendix is not distinctly seen. There is no pericecal inflammation. There is no significant wall thickening in colon. There is no pericolic stranding or fluid collection. Vascular/Lymphatic: Unremarkable. Reproductive: Unremarkable. Other: There is no ascites or pneumoperitoneum. Musculoskeletal: Unremarkable. IMPRESSION: There is no evidence of  intestinal obstruction or pneumoperitoneum. There is no hydronephrosis. There is fluid in the lumen of small bowel loops which may be normal variation or suggest nonspecific enteritis. Right renal cyst. Electronically Signed   By: Ernie Avena M.D.   On: 03/20/2021 10:00    Procedures Procedures    Medications Ordered in ED Medications  iohexol (OMNIPAQUE) 350 MG/ML injection 80 mL (80 mLs Intravenous Contrast Given 03/20/21 8502)    ED Course/ Medical Decision Making/ A&P                           Medical Decision Making  This patient presents to the ED for concern of abdominal pain, this involves an extensive number of treatment options, and is a complaint that carries with it a high risk of complications and morbidity.  The differential diagnosis includes none differential abdominal pain, diverticulitis, appendicitis, cholecystitis.         Lab Tests:  I Ordered, and personally interpreted labs.  The pertinent results include:  CBC reassuring.  LFTs and basic metabolic also reassuring.  Bilirubin only mildly elevated and of unclear significance.  Normal lipase.   Imaging Studies ordered:  I ordered imaging studies including CT scan of the abdomen pelvis. I independently visualized and interpreted imaging which showed nonspecific changes.  Normal versus enteritis. I agree with the radiologist interpretation        Test Considered:  Ultrasound.  Not deemed necessary with CT scan.       Problem List / ED Course:  Patient presented with upper abdominal pain.  Had for the last couple weeks.  Worse sometimes after eating.  Improved when he has gas.  Not necessarily urinary symptoms.  Lab work reassuring.  CT scan done and showed nonspecific enteritis versus normal findings.  Well-appearing.  Bilirubin slightly elevated unclear significance.  Patient appears stable for discharge.  Will have follow-up with gastroenterology.  Appears stable for discharge  home.   Reevaluation:  After the interventions noted above, I reevaluated the patient and found that they have :improved    Dispostion:  After consideration of the diagnostic results and the patients response to treatment, I feel that the patent would benefit from discharge with outpatient follow-up..         Final Clinical Impression(s) / ED Diagnoses Final diagnoses:  Right flank pain    Rx / DC Orders ED Discharge Orders     None         Benjiman Core, MD 03/20/21 1032

## 2021-03-20 NOTE — Discharge Instructions (Signed)
Your lab work and CAT scan were overall reassuring.  Follow-up with gastroenterology for further evaluation of your pain.

## 2023-10-10 IMAGING — CT CT ABD-PELV W/ CM
2 of 4 series · 16 of 46 positions shown, 18 images · IV contrast (OMNIPAQUE 350)
Comparison: 03/27/2009

CLINICAL DATA: Abdominal pain

EXAM:
CT ABDOMEN AND PELVIS WITH CONTRAST
TECHNIQUE: Multidetector CT imaging of the abdomen and pelvis was performed
using the standard protocol following bolus administration of
intravenous contrast.
CONTRAST:  80mL OMNIPAQUE IOHEXOL 350 MG/ML SOLN

[Series 2: axial st · axial · 0.74mm/px · z∈[-491,-76]mm · 13 of 93 slices shown, 15 images]
[im 5/93  soft-tissue]
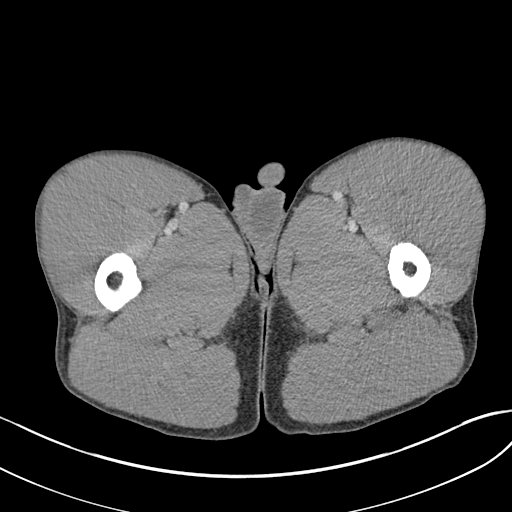
[im 5/93  bone]
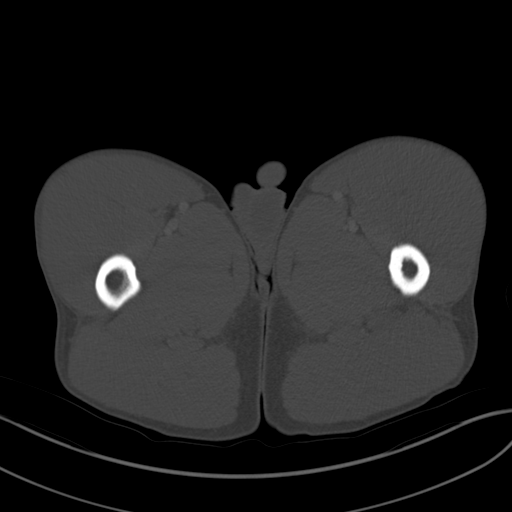
[im 15/93  soft-tissue]
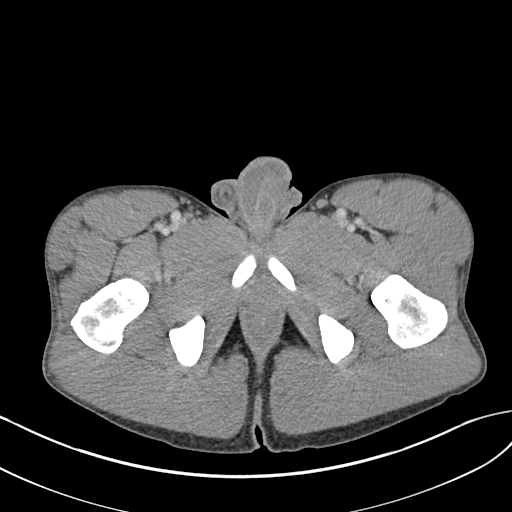
[im 20/93  soft-tissue]
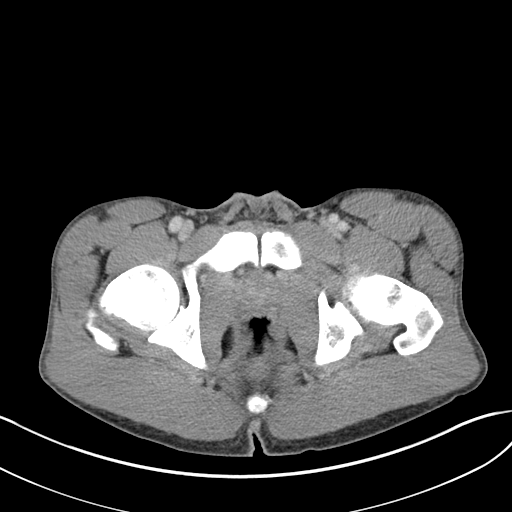
[im 25/93  soft-tissue]
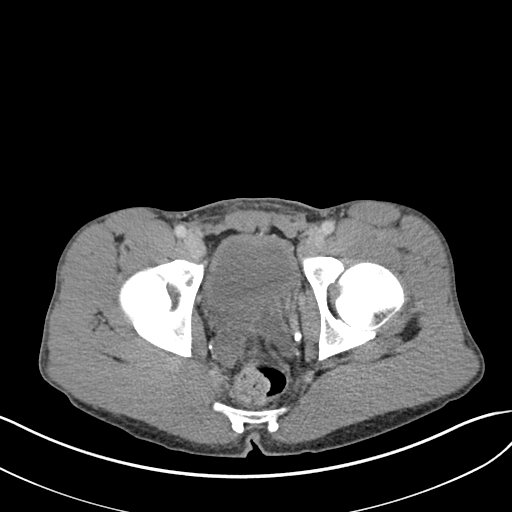
[im 34/93  soft-tissue]
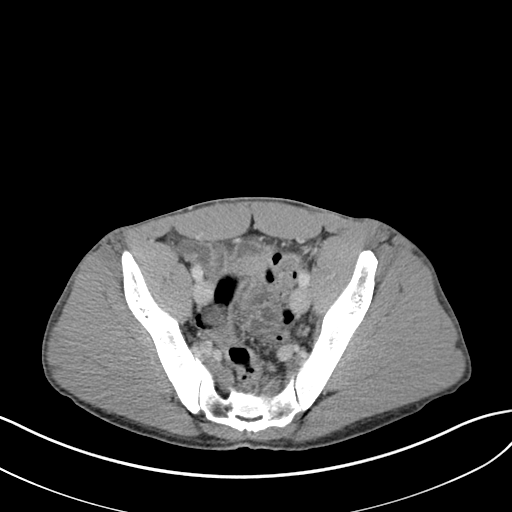
[im 39/93  soft-tissue]
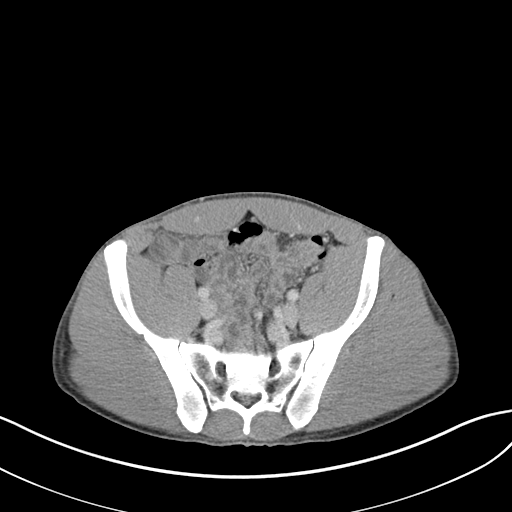
[im 49/93  soft-tissue]
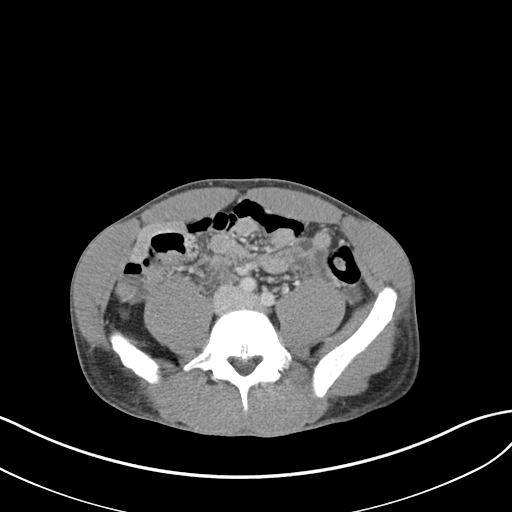
[im 54/93  soft-tissue]
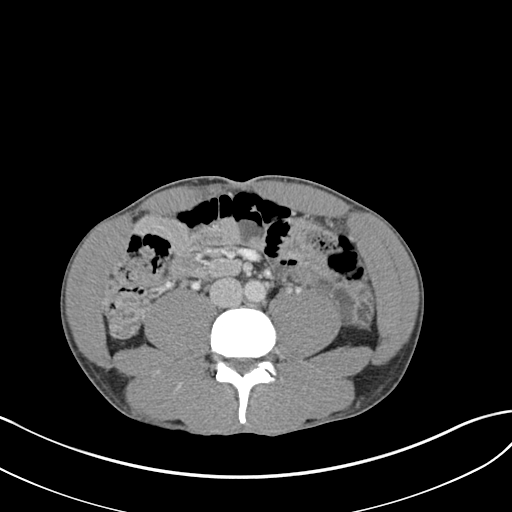
[im 59/93  soft-tissue]
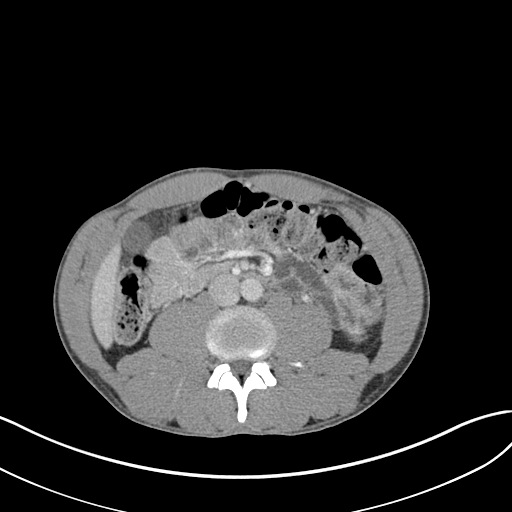
[im 59/93  bone]
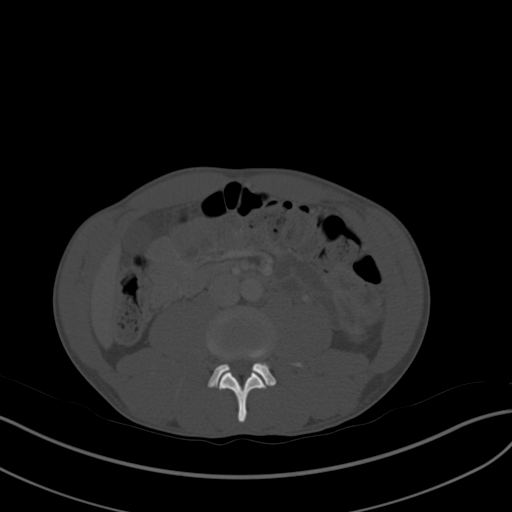
[im 68/93  soft-tissue]
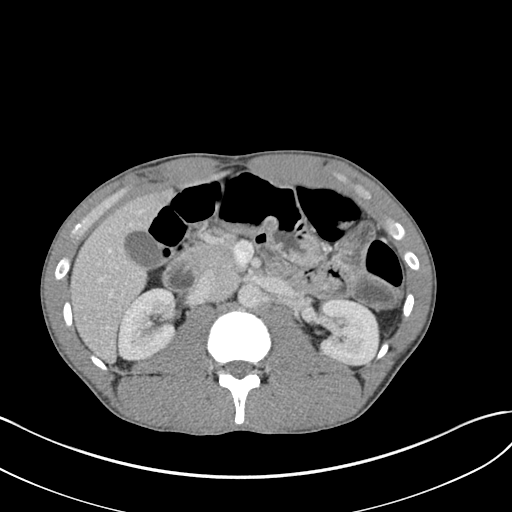
[im 73/93  soft-tissue]
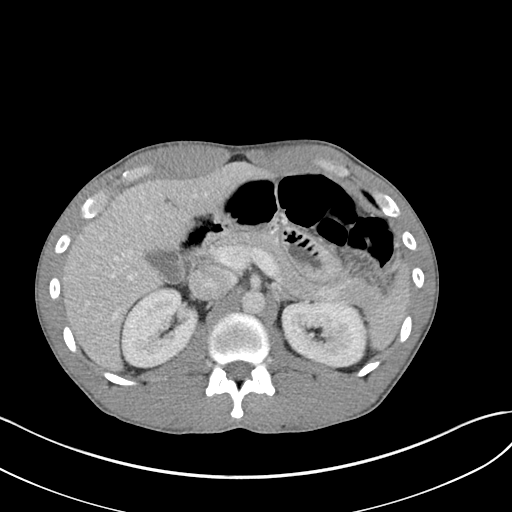
[im 78/93  soft-tissue]
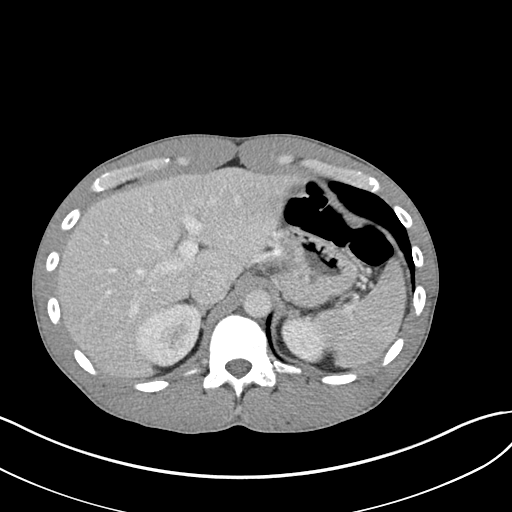
[im 88/93  soft-tissue]
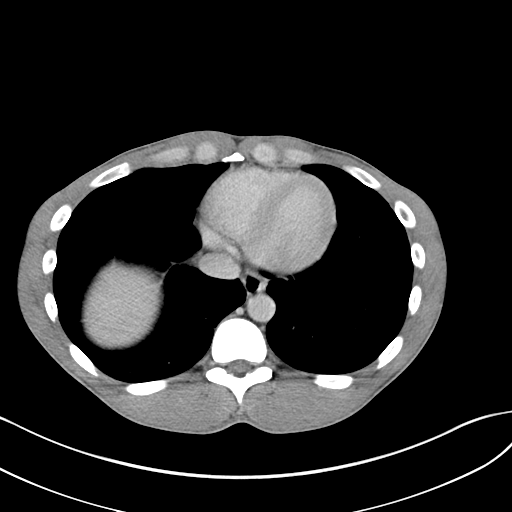

[Series 4: coronal st · coronal · 0.68mm/px · 3 of 125 slices shown]
[im 42/125  soft-tissue]
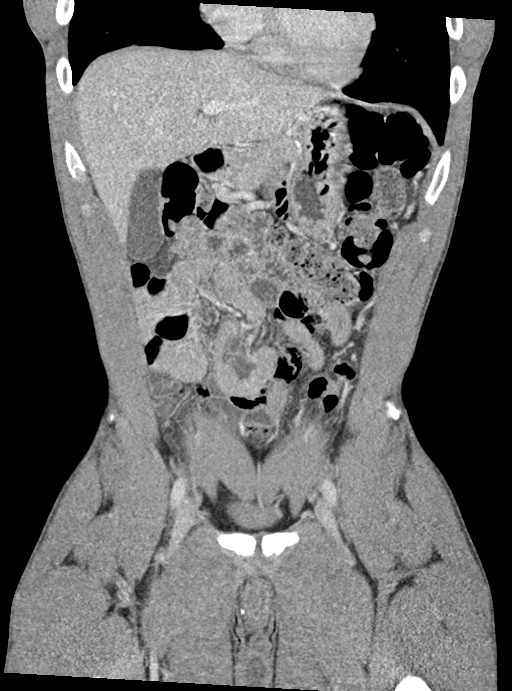
[im 56/125  soft-tissue]
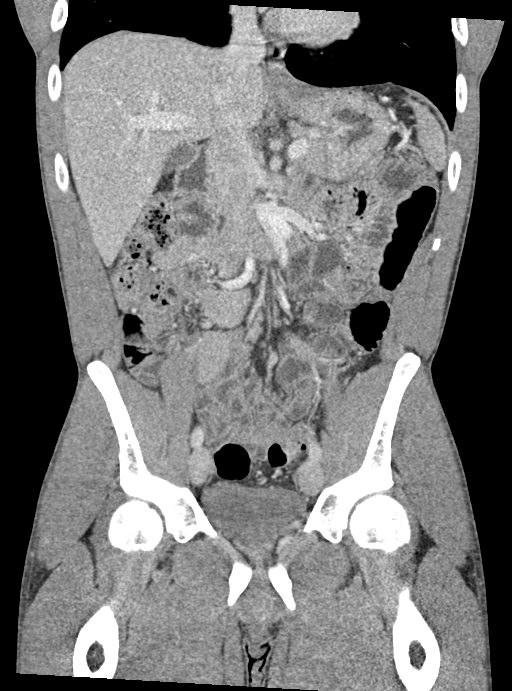
[im 69/125  soft-tissue]
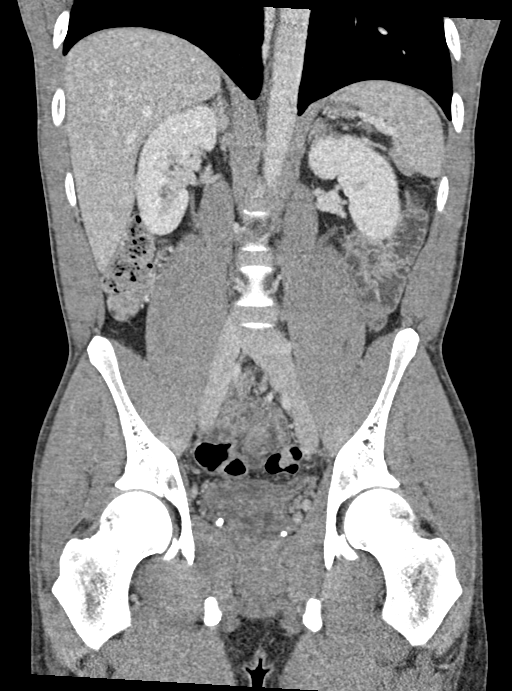

[16 of 46 positions shown; findings below may reference images not displayed]

FINDINGS: Lower chest: Unremarkable.

Hepatobiliary: Unremarkable.

Pancreas: No focal abnormality is seen.

Spleen: Unremarkable.

Adrenals/Urinary Tract: Adrenals are unremarkable. There is no
hydronephrosis. There are no renal or ureteral stones. Urinary
bladder is not distended. There is 1.3 cm smooth marginated
low-density lesion in the lower pole of right kidney possibly renal
cyst.

Stomach/Bowel: Stomach is not distended. Small bowel loops are not
dilated. There is fluid in the lumen of small bowel loops. Appendix
is not distinctly seen. There is no pericecal inflammation. There is
no significant wall thickening in colon. There is no pericolic
stranding or fluid collection.

Vascular/Lymphatic: Unremarkable.

Reproductive: Unremarkable.

Other: There is no ascites or pneumoperitoneum.

Musculoskeletal: Unremarkable.
IMPRESSION: There is no evidence of intestinal obstruction or pneumoperitoneum.
There is no hydronephrosis.

There is fluid in the lumen of small bowel loops which may be normal
variation or suggest nonspecific enteritis.

Right renal cyst.
